# Patient Record
Sex: Male | Born: 1970 | Race: White | Hispanic: No | Marital: Married | Smoking: Never smoker
Health system: Southern US, Community
[De-identification: ages and names within clinical notes are randomized; demographics above are authoritative.]

## PROBLEM LIST (undated history)

## (undated) DIAGNOSIS — N200 Calculus of kidney: Secondary | ICD-10-CM

## (undated) DIAGNOSIS — I10 Essential (primary) hypertension: Secondary | ICD-10-CM

## (undated) HISTORY — DX: Calculus of kidney: N20.0

## (undated) HISTORY — DX: Essential (primary) hypertension: I10

---
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
## 2003-11-18 ENCOUNTER — Emergency Department (HOSPITAL_COMMUNITY): Admission: EM | Admit: 2003-11-18 | Discharge: 2003-11-18 | Payer: Self-pay | Admitting: Emergency Medicine

## 2003-12-03 ENCOUNTER — Ambulatory Visit (HOSPITAL_BASED_OUTPATIENT_CLINIC_OR_DEPARTMENT_OTHER): Admission: RE | Admit: 2003-12-03 | Discharge: 2003-12-03 | Payer: Self-pay | Admitting: Urology

## 2004-10-25 ENCOUNTER — Ambulatory Visit: Payer: Self-pay | Admitting: Family Medicine

## 2004-12-23 ENCOUNTER — Ambulatory Visit: Payer: Self-pay | Admitting: Internal Medicine

## 2004-12-27 ENCOUNTER — Ambulatory Visit: Payer: Self-pay | Admitting: Internal Medicine

## 2005-10-12 ENCOUNTER — Ambulatory Visit: Payer: Self-pay | Admitting: Family Medicine

## 2005-12-22 ENCOUNTER — Encounter: Admission: RE | Admit: 2005-12-22 | Discharge: 2005-12-22 | Payer: Self-pay | Admitting: Family Medicine

## 2005-12-22 ENCOUNTER — Ambulatory Visit: Payer: Self-pay | Admitting: Family Medicine

## 2005-12-26 ENCOUNTER — Encounter: Admission: RE | Admit: 2005-12-26 | Discharge: 2006-02-01 | Payer: Self-pay | Admitting: Family Medicine

## 2006-01-16 ENCOUNTER — Ambulatory Visit: Payer: Self-pay | Admitting: Family Medicine

## 2006-09-06 ENCOUNTER — Ambulatory Visit: Payer: Self-pay | Admitting: Internal Medicine

## 2006-09-06 LAB — CONVERTED CEMR LAB: Cyclic Citrullin Peptide Ab: 4 units (ref <20)

## 2006-10-12 ENCOUNTER — Ambulatory Visit: Payer: Self-pay | Admitting: Internal Medicine

## 2007-04-24 ENCOUNTER — Ambulatory Visit: Payer: Self-pay | Admitting: Family Medicine

## 2007-04-24 DIAGNOSIS — J069 Acute upper respiratory infection, unspecified: Secondary | ICD-10-CM | POA: Insufficient documentation

## 2007-06-14 ENCOUNTER — Ambulatory Visit: Payer: Self-pay | Admitting: Family Medicine

## 2007-06-14 DIAGNOSIS — J019 Acute sinusitis, unspecified: Secondary | ICD-10-CM

## 2007-12-10 ENCOUNTER — Emergency Department (HOSPITAL_COMMUNITY): Admission: EM | Admit: 2007-12-10 | Discharge: 2007-12-10 | Payer: Self-pay | Admitting: Emergency Medicine

## 2007-12-12 ENCOUNTER — Ambulatory Visit: Payer: Self-pay | Admitting: Family Medicine

## 2007-12-12 DIAGNOSIS — IMO0002 Reserved for concepts with insufficient information to code with codable children: Secondary | ICD-10-CM

## 2007-12-26 ENCOUNTER — Ambulatory Visit: Payer: Self-pay | Admitting: Family Medicine

## 2007-12-26 DIAGNOSIS — N200 Calculus of kidney: Secondary | ICD-10-CM | POA: Insufficient documentation

## 2007-12-26 HISTORY — DX: Calculus of kidney: N20.0

## 2007-12-30 LAB — CONVERTED CEMR LAB
BUN: 10 mg/dL (ref 6–23)
CO2: 31 meq/L (ref 19–32)
Chloride: 102 meq/L (ref 96–112)
Direct LDL: 127 mg/dL
Glucose, Bld: 90 mg/dL (ref 70–99)
Potassium: 4.2 meq/L (ref 3.5–5.1)
Sodium: 139 meq/L (ref 135–145)

## 2008-12-14 ENCOUNTER — Encounter (INDEPENDENT_AMBULATORY_CARE_PROVIDER_SITE_OTHER): Payer: Self-pay | Admitting: *Deleted

## 2008-12-14 ENCOUNTER — Ambulatory Visit: Payer: Self-pay | Admitting: Family Medicine

## 2008-12-14 DIAGNOSIS — M79609 Pain in unspecified limb: Secondary | ICD-10-CM

## 2009-06-25 ENCOUNTER — Ambulatory Visit: Payer: Self-pay | Admitting: Family Medicine

## 2009-12-09 ENCOUNTER — Ambulatory Visit: Payer: Self-pay | Admitting: Family Medicine

## 2010-07-05 NOTE — Assessment & Plan Note (Signed)
Summary: Eric Avila,HA,CONGESTION/CLE   Vital Signs:  Patient profile:   40 year old male Height:      70 inches Weight:      228.13 pounds BMI:     32.85 Temp:     98.4 degrees F oral Pulse rate:   92 / minute Pulse rhythm:   regular BP sitting:   122 / 84  (left arm) Cuff size:   large  Vitals Entered By: Delilah Shan CMA Duncan Dull) (June 25, 2009 11:54 AM) CC: Eric Avila, headache, congestion   History of Present Illness: 40 yo with frontal headache, sinus congestion and pressure x 3 weeks. Ears are popping, sometimes Eric Avila when he lies down. No n/v/d. No sycope or presyncope. denies symptoms of orthostatis. Has had fevers and chills (subjective). Mildly productive cough, no wheezing, no shortness of breath.  Current Medications (verified): 1)  Augmentin 875-125 Mg Tabs (Amoxicillin-Pot Clavulanate) .Marland Kitchen.. 1 Tab By Mouth Two Times A Day X 10 Days  Allergies (verified): No Known Drug Allergies  Review of Systems      See HPI General:  Complains of chills and fever; denies malaise. ENT:  Complains of nasal congestion, postnasal drainage, and sinus pressure; denies sore throat. CV:  Denies chest pain or discomfort, difficulty breathing at night, difficulty breathing while lying down, leg cramps with exertion, near fainting, palpitations, and shortness of breath with exertion. Resp:  Complains of cough and sputum productive; denies shortness of breath and wheezing.  Physical Exam  General:  alert, well-developed, well-nourished, and well-hydrated.   Ears:  mod clear fluid TMs bilaterally. Nose:  nasal dischargemucosal pallor.  L > R Frontal sinuses TTP Mouth:  good dentition, pharynx pink and moist, and no exudates.   Lungs:  normal respiratory effort, no intercostal retractions, no accessory muscle use, and normal breath sounds.   Heart:  normal rate, regular rhythm, and no murmur.   Psych:  normally interactive, not anxious appearing, and not depressed appearing.      Impression & Recommendations:  Problem # 1:  SINUSITIS- ACUTE-NOS (ICD-461.9) Assessment New Given duration of symptoms, will treat with abx. Continue supportive care.  See patient instructions for details. His updated medication list for this problem includes:    Augmentin 875-125 Mg Tabs (Amoxicillin-pot clavulanate) .Marland Kitchen... 1 tab by mouth two times a day x 10 days  Complete Medication List: 1)  Augmentin 875-125 Mg Tabs (Amoxicillin-pot clavulanate) .Marland Kitchen.. 1 tab by mouth two times a day x 10 days Prescriptions: AUGMENTIN 875-125 MG TABS (AMOXICILLIN-POT CLAVULANATE) 1 tab by mouth two times a day x 10 days  #20 x 0   Entered and Authorized by:   Ruthe Mannan MD   Signed by:   Ruthe Mannan MD on 06/25/2009   Method used:   Electronically to        [facility] [address] [phone] (retail)       [address]       [place], [place]  [address]       Ph: [phone] or [phone]       Fax: [phone]   RxID:   [id number]   Prior Medications (reviewed today): None Current Allergies (reviewed today): No known allergies

## 2010-07-05 NOTE — Letter (Signed)
Summary: Physical Exam Form/[employer]  Physical Exam Form/[employer]   Imported By: Lanelle Bal 12/14/2009 09:48:06  _____________________________________________________________________  External Attachment:    Type:   Image     Comment:   External Document

## 2010-07-05 NOTE — Assessment & Plan Note (Signed)
Summary: [employer]/CLE   Vital Signs:  Patient profile:   40 year old male Height:      70 inches Weight:      220.4 pounds BMI:     31.74 Temp:     98.7 degrees F oral Pulse rate:   92 / minute Pulse rhythm:   regular BP sitting:   122 / 90  (left arm) Cuff size:   large  Vitals Entered By: Benny Lennert CMA (AAMA) (December 09, 2009 8:22 AM)  History of Present Illness: Chief complaint [employer] physical    Preventive Screening-Counseling & Management  Alcohol-Tobacco     Alcohol drinks/day: <1     Alcohol Counseling: not indicated; use of alcohol is not excessive or problematic     Smoking Status: never     Passive Smoke Exposure: yes  Caffeine-Diet-Exercise     Caffeine use/day: 3     Does Patient Exercise: yes     Type of exercise: walks 1 to 1 and 1/2 mile qd     Times/week: 7  Hep-HIV-STD-Contraception     HIV Risk: no     STD Risk: no risk noted     Testicular SE Education/Counseling to perform regular STE     Sun Exposure-Excessive: occasionally  Safety-Violence-Falls     Seat Belt Use: 75      Sexual History:  currently monogamous.        Drug Use:  no.    Allergies (verified): No Known Drug Allergies  Past History:  Past medical, surgical, family and social histories (including risk factors) reviewed, and no changes noted (except as noted below).  Past Medical History: Reviewed history from 02/11/2007 and no changes required. Unremarkable  Past Surgical History: Reviewed history from 12/26/2007 and no changes required. Denies surgical history R knee open and orthroscopic--Dr Fannie Knee  Family History: Reviewed history from 12/26/2007 and no changes required. Father: --HBP Mother: DM, HBP, heart problems Siblings: 2 br--1--DM, HBP, obese                         1--L&W   DM-0 MI- P side.  MGF died of CHF, 2 P uncles have cerebral aneurisms CVA-  Prostate Cancer-0 Breast Cancer-0 Ovarian Cancer-0 Uterine Cancer-0 Colon  Cancer-0 Drug/ ETOH Abuse-0 Depression-   Social History: Reviewed history from 12/26/2007 and no changes required. Never Smoked Alcohol use-no Marital Status: Married Children: 1 Occupation: Curator STD Risk:  no risk noted Sexual History:  currently monogamous  Review of Systems  General: Denies fever, chills, sweats, and anorexia. Eyes: Denies blurring. ENT: Denies earache, ear discharge, decreased hearing, nasal congestion, and sore throat. CV: Denies chest pains, dyspnea on exertion, palpitations, and syncope. Resp: Denies cough, cough with exercise, dyspnea at rest, excessive sputum, nighttime cough or wheeze, and wheezing GI: Denies nausea, vomiting, diarrhea, constipation, change in bowel habits, abdominal pain, melena, BRBPR  GU: dysuria, discharge, frequency,genital sores, STD concern. MS: no back pain, joint pain, stiffness, and arthritis. Derm: No rash, itching, and dryness Neuro: No abnormal gait, frequent headaches, paresthesias, seizures, vertigo, and weakness Psych: No anxiety, behavioral problems, compulsive behavior, depression, hyperactivity, and inattentive. Endo: No polydipsia, polyphagia, polyuria, and unusual weight change Heme: No bruising or LAD Allergy: No urticaria or hayfever   Otherwise, the pertinent positives and negatives are listed above and in the HPI, otherwise a full review of systems has been reviewed and is negative unless noted positive.   Physical Exam  General:  Well-developed,well-nourished,in no acute distress; alert,appropriate and cooperative throughout examination Head:  normocephalic and atraumatic.   Eyes:  pupils equal, pupils round, pupils reactive to light, and pupils react to accomodation.   Ears:  External ear exam shows no significant lesions or deformities.  Otoscopic exam on L shows dull TM Nose:  External nasal examination shows no deformity or inflammation. Nasal mucosa are pink and moist without lesions or  exudates. Mouth:  Oral mucosa and oropharynx without lesions or exudates.  Teeth in good repair. Neck:  No deformities, masses, or tenderness noted. Chest Wall:  No deformities, masses, tenderness or gynecomastia noted. Lungs:  Normal respiratory effort, chest expands symmetrically. Lungs are clear to auscultation, no crackles or wheezes. Heart:  Normal rate and regular rhythm. S1 and S2 normal without gallop, murmur, click, rub or other extra sounds. Abdomen:  Bowel sounds positive,abdomen soft and non-tender without masses, organomegaly or hernias noted. Genitalia:  Testes bilaterally descended without nodularity, tenderness or masses. No scrotal masses or lesions. No penis lesions or urethral discharge. Msk:  normal ROM and no crepitation.   Extremities:  No clubbing, cyanosis, edema, or deformity noted with normal full range of motion of all joints.   Neurologic:  alert & oriented X3 and gait normal.   Cervical Nodes:  No lymphadenopathy noted Psych:  Cognition and judgment appear intact. Alert and cooperative with normal attention span and concentration. No apparent delusions, illusions, hallucinations   Impression & Recommendations:  Problem # 1:  HEALTH MAINTENANCE EXAM (ICD-V70.0) The patient's preventative maintenance and recommended screening tests for an annual wellness exam were reviewed in full today. Brought up to date unless services declined.  Counselled on the importance of diet, exercise, and its role in overall health and mortality. The patient's FH and SH was reviewed, including their home life, tobacco status, and drug and alcohol status.   scout forms completed  Current Allergies (reviewed today): No known allergies

## 2010-10-21 NOTE — Op Note (Signed)
NAME:  Eric Avila, Eric Avila                         ACCOUNT NO.:  [id number]   MEDICAL RECORD NO.:  [id number]                   PATIENT TYPE:  AMB   LOCATION:  [facility]                                 FACILITY:  [facility]   PHYSICIAN:  Sigmund I. Patsi Sears, M.D.         DATE OF BIRTH:  07-04-1970   DATE OF PROCEDURE:  12/03/2003  DATE OF DISCHARGE:                                 OPERATIVE REPORT   PREOPERATIVE DIAGNOSIS:  Distal left ureterovesical junction stone.   POSTOPERATIVE DIAGNOSIS:  Distal left ureterovesical junction stone.   OPERATION:  1. Cystourethroscopy.  2. Left retrograde pyelogram.  3. Basket extraction of left lower ureteral stone.   SURGEON:  Sigmund I. Patsi Sears, M.D.   ANESTHESIA:  General LMA.   PREPARATION:  __________.   The patient was brought to the operating room, placed on the operating table  in the dorsal supine position where a general LMA anesthesia was introduced.  He was then replaced in the dorsal lithotomy position where the pubis was  prepped with Betadine solution and draped in the usual fashion.   PROCEDURE:  Cystourethroscopy was accomplished, and shows the stone in the  left intramural portion of the ureter.  A basket was passed around the  stone, and the stone extracted.  The stone was multifaceted, was photo  documented.  A left retrograde pyelogram was then performed again, showing  no evidence of stone in the ureter, and under fluoroscopic control, contrast  was seen to easily flow out the ureteral orifice.  Therefore, no double-J  catheter was passed.  The patient was given IV Toradol, awakened and taken  to the recovery room in good condition.                                               Sigmund I. Patsi Sears, M.D.    SIT/MEDQ  D:  12/03/2003  T:  12/03/2003  Job:  [id number]

## 2014-01-16 ENCOUNTER — Encounter: Payer: Self-pay | Admitting: Internal Medicine

## 2014-01-16 ENCOUNTER — Ambulatory Visit (INDEPENDENT_AMBULATORY_CARE_PROVIDER_SITE_OTHER): Payer: 59 | Admitting: Internal Medicine

## 2014-01-16 VITALS — BP 130/84 | HR 77 | Temp 98.1°F | Wt 238.5 lb

## 2014-01-16 DIAGNOSIS — R5383 Other fatigue: Principal | ICD-10-CM

## 2014-01-16 DIAGNOSIS — R079 Chest pain, unspecified: Secondary | ICD-10-CM | POA: Diagnosis not present

## 2014-01-16 DIAGNOSIS — R5381 Other malaise: Secondary | ICD-10-CM

## 2014-01-16 DIAGNOSIS — IMO0001 Reserved for inherently not codable concepts without codable children: Secondary | ICD-10-CM

## 2014-01-16 DIAGNOSIS — R030 Elevated blood-pressure reading, without diagnosis of hypertension: Secondary | ICD-10-CM

## 2014-01-16 LAB — COMPREHENSIVE METABOLIC PANEL
ALBUMIN: 4.3 g/dL (ref 3.5–5.2)
ALK PHOS: 68 U/L (ref 39–117)
ALT: 23 U/L (ref 0–53)
AST: 18 U/L (ref 0–37)
BUN: 11 mg/dL (ref 6–23)
CHLORIDE: 103 meq/L (ref 96–112)
CO2: 28 meq/L (ref 19–32)
Calcium: 9.1 mg/dL (ref 8.4–10.5)
Creat: 0.98 mg/dL (ref 0.50–1.35)
GLUCOSE: 88 mg/dL (ref 70–99)
Potassium: 3.9 mEq/L (ref 3.5–5.3)
SODIUM: 140 meq/L (ref 135–145)
TOTAL PROTEIN: 7 g/dL (ref 6.0–8.3)
Total Bilirubin: 0.4 mg/dL (ref 0.2–1.2)

## 2014-01-16 LAB — CBC
HCT: 39.6 % (ref 39.0–52.0)
Hemoglobin: 14 g/dL (ref 13.0–17.0)
MCH: 30.7 pg (ref 26.0–34.0)
MCHC: 35.4 g/dL (ref 30.0–36.0)
MCV: 86.8 fL (ref 78.0–100.0)
PLATELETS: 247 10*3/uL (ref 150–400)
RBC: 4.56 MIL/uL (ref 4.22–5.81)
RDW: 14.1 % (ref 11.5–15.5)
WBC: 8 10*3/uL (ref 4.0–10.5)

## 2014-01-16 LAB — LIPID PANEL
CHOL/HDL RATIO: 5.6 ratio
CHOLESTEROL: 197 mg/dL (ref 0–200)
HDL: 35 mg/dL — ABNORMAL LOW (ref >39)
LDL Cholesterol: 110 mg/dL — ABNORMAL HIGH (ref 0–99)
Triglycerides: 262 mg/dL — ABNORMAL HIGH (ref <150)
VLDL: 52 mg/dL — AB (ref 0–40)

## 2014-01-16 NOTE — Progress Notes (Signed)
Pre visit review using our clinic review tool, if applicable. No additional management support is needed unless otherwise documented below in the visit note. 

## 2014-01-16 NOTE — Patient Instructions (Addendum)

## 2014-01-16 NOTE — Progress Notes (Signed)
Subjective:    Patient ID: Eric Avila, male    DOB: 1971/05/26, 43 y.o.   MRN: [id number]  HPI  Pt presents to the clinic today with c/o elevated blood pressure. He noticed this 2 days ago. He reports his blood pressure was 157/90. He has had some headache and fatigue but denies chest tightness or shortness of breath.He does report chest pain on his left side and in the left side of his back. The pain does not radiate. He is a smoker. He has no previous heart history that he is aware of.  Of note, he is a new patient who will be establishing care with Dr. Patsy Lageropland next week.  Review of Systems      No past medical history on file.  No current outpatient prescriptions on file.   No current facility-administered medications for this visit.    Not on File  No family history on file.  History   Social History  . Marital Status: Married    Spouse Name: N/A    Number of Children: N/A  . Years of Education: N/A   Occupational History  . Not on file.   Social History Main Topics  . Smoking status: Not on file  . Smokeless tobacco: Not on file  . Alcohol Use: Not on file  . Drug Use: Not on file  . Sexual Activity: Not on file   Other Topics Concern  . Not on file   Social History Narrative  . No narrative on file     Constitutional: Pt reports fatigue. Denies fever, malaise, headache or abrupt weight changes.  Respiratory: Denies difficulty breathing, shortness of breath, cough or sputum production.   Cardiovascular: Denies chest pain, chest tightness, palpitations or swelling in the hands or feet.  Neurological: Denies dizziness, difficulty with memory, difficulty with speech or problems with balance and coordination.   No other specific complaints in a complete review of systems (except as listed in HPI above).  Objective:   Physical Exam   BP 130/84  Pulse 77  Temp(Src) 98.1 F (36.7 C) (Oral)  Wt 238 lb 8 oz (108.183 kg)  SpO2 97% Wt Readings from  Last 3 Encounters:  01/16/14 238 lb 8 oz (108.183 kg)  12/09/09 220 lb 6.4 oz (99.973 kg)  06/25/09 228 lb 2.1 oz (103.48 kg)    General: Appears his stated age,obese but  well developed, well nourished in NAD. Cardiovascular: Normal rate and rhythm. S1,S2 noted.  No murmur, rubs or gallops noted. No JVD or BLE edema. No carotid bruits noted. Pulmonary/Chest: Normal effort and positive vesicular breath sounds. No respiratory distress. No wheezes, rales or ronchi noted.    BMET    Component Value Date/Time   NA 139 12/26/2007 1519   K 4.2 12/26/2007 1519   CL 102 12/26/2007 1519   CO2 31 12/26/2007 1519   GLUCOSE 90 12/26/2007 1519   BUN 10 12/26/2007 1519   CREATININE 1.0 12/26/2007 1519   CALCIUM 9.4 12/26/2007 1519   GFRNONAA 89 12/26/2007 1519   GFRAA 108 12/26/2007 1519    Lipid Panel     Component Value Date/Time   CHOL 208* 12/26/2007 1519         Assessment & Plan:   Elevated blood pressure, fatigue, chest pain:  Will obtain ECG today Will obtain CBC, CMET and lipid as well BP not elevated today- will hold off on medication and have you address this with Dr. Patsy Lageropland next week Pt does not  appear in distress, I think it is okay to continue outpatient followup May eventually need referral to cardiology for stress test but will wait until labs are back  F/u in 1 week with Dr. Patsy Lager

## 2014-01-21 ENCOUNTER — Encounter: Payer: Self-pay | Admitting: Family Medicine

## 2014-01-21 ENCOUNTER — Ambulatory Visit (INDEPENDENT_AMBULATORY_CARE_PROVIDER_SITE_OTHER): Payer: PRIVATE HEALTH INSURANCE | Admitting: Family Medicine

## 2014-01-21 VITALS — BP 126/82 | HR 70 | Temp 98.2°F | Ht 70.5 in | Wt 239.0 lb

## 2014-01-21 DIAGNOSIS — R5381 Other malaise: Secondary | ICD-10-CM

## 2014-01-21 DIAGNOSIS — R5383 Other fatigue: Principal | ICD-10-CM

## 2014-01-21 NOTE — Progress Notes (Signed)
[facility] [address] [place], [address] Phone: [phone] Fax: [phone] 01/21/2014  MRN: [id number] DOB: 1971-04-17 Primary Physician:  Hannah Beat, MD   Subjective:   History of Present Illness:  Eric Avila is a 43 y.o. very pleasant male patient who presents with the following:  The patient presents for followup lab work, and he has been having some significant fatigue off and on for months. He is working approximately 75-80 hours a week. He is tired often falls asleep easily when he is not at work. He works at a [employer] and [employer].   Everything is going along well at home, and he does snore, but only minimally. Body mass index is 33.8 kg/(m^2).   Past Medical History, Surgical History, Social History, Family History, Problem List, Medications, and Allergies have been reviewed and updated if relevant.  Review of Systems:  GEN: No acute illnesses, no fevers, chills. GI: No n/v/d, eating normally Pulm: No SOB Interactive and getting along well at home.  Otherwise, ROS is as per the HPI.  Objective:   Physical Examination: BP 126/82  Pulse 70  Temp(Src) 98.2 F (36.8 C) (Oral)  Ht 5' 10.5" (1.791 m)  Wt 239 lb (108.41 kg)  BMI 33.80 kg/m2   GEN: WDWN, NAD, Non-toxic, Alert & Oriented x 3 HEENT: Atraumatic, Normocephalic.  Ears and Nose: No external deformity. EXTR: No clubbing/cyanosis/edema NEURO: Normal gait.  PSYCH: Normally interactive. Conversant. Not depressed or anxious appearing.  Calm demeanor.   Laboratory and Imaging Data: Results for orders placed in visit on 01/16/14  CBC      Result Value Ref Range   WBC 8.0  4.0 - 10.5 K/uL   RBC 4.56  4.22 - 5.81 MIL/uL   Hemoglobin 14.0  13.0 - 17.0 g/dL   HCT 86.5  78.4 - 69.6 %   MCV 86.8  78.0 - 100.0 fL   MCH 30.7  26.0 - 34.0 pg   MCHC 35.4  30.0 - 36.0 g/dL   RDW 29.5  28.4 - 13.2 %   Platelets 247  150 - 400 K/uL    COMPREHENSIVE METABOLIC PANEL      Result Value Ref Range   Sodium 140  135 - 145 mEq/L   Potassium 3.9  3.5 - 5.3 mEq/L   Chloride 103  96 - 112 mEq/L   CO2 28  19 - 32 mEq/L   Glucose, Bld 88  70 - 99 mg/dL   BUN 11  6 - 23 mg/dL   Creat 4.40  1.02 - 7.25 mg/dL   Total Bilirubin 0.4  0.2 - 1.2 mg/dL   Alkaline Phosphatase 68  39 - 117 U/L   AST 18  0 - 37 U/L   ALT 23  0 - 53 U/L   Total Protein 7.0  6.0 - 8.3 g/dL   Albumin 4.3  3.5 - 5.2 g/dL   Calcium 9.1  8.4 - 36.6 mg/dL  LIPID PANEL      Result Value Ref Range   Cholesterol 197  0 - 200 mg/dL   Triglycerides 440 (*) <150 mg/dL   HDL 35 (*) >34 mg/dL   Total CHOL/HDL Ratio 5.6     VLDL 52 (*) 0 - 40 mg/dL   LDL Cholesterol 742 (*) 0 - 99 mg/dL     Assessment & Plan:   Other malaise and fatigue   We reviewed all of his recent lab work. He  has a mildly elevated cholesterol level. Recommended that he lose at least 20 pounds, and also think most of this fatigue is coming from working so much. Recommended that he discontinue if he is able, get better exercise and sleep patterns.  If his symptoms persist despite this, we discussed potentially checking hormone levels, but at this point he is not ready to go on any kind of medication anyway.  Signed,  Elpidio GaleaSpencer T. Camie Hauss, MD, CAQ Sports Medicine   Patient's Medications  New Prescriptions   No medications on file  Previous Medications   IBUPROFEN (ADVIL,MOTRIN) 200 MG TABLET    Take 200 mg by mouth every 6 (six) hours as needed.  Modified Medications   No medications on file  Discontinued Medications   No medications on file

## 2014-01-21 NOTE — Progress Notes (Signed)
Pre visit review using our clinic review tool, if applicable. No additional management support is needed unless otherwise documented below in the visit note. 

## 2014-12-14 ENCOUNTER — Ambulatory Visit (INDEPENDENT_AMBULATORY_CARE_PROVIDER_SITE_OTHER): Payer: 59 | Admitting: Family Medicine

## 2014-12-14 ENCOUNTER — Encounter: Payer: Self-pay | Admitting: Family Medicine

## 2014-12-14 VITALS — BP 140/90 | HR 78 | Temp 98.6°F | Ht 70.5 in | Wt 245.2 lb

## 2014-12-14 DIAGNOSIS — S96812A Strain of other specified muscles and tendons at ankle and foot level, left foot, initial encounter: Secondary | ICD-10-CM | POA: Diagnosis not present

## 2014-12-14 DIAGNOSIS — S93692A Other sprain of left foot, initial encounter: Secondary | ICD-10-CM

## 2014-12-14 NOTE — Progress Notes (Signed)
Pre visit review using our clinic review tool, if applicable. No additional management support is needed unless otherwise documented below in the visit note. 
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
## 2016-08-29 ENCOUNTER — Ambulatory Visit (INDEPENDENT_AMBULATORY_CARE_PROVIDER_SITE_OTHER): Payer: 59 | Admitting: Family Medicine

## 2016-08-29 ENCOUNTER — Encounter: Payer: Self-pay | Admitting: Family Medicine

## 2016-08-29 VITALS — BP 160/78 | HR 83 | Temp 98.3°F | Ht 70.5 in | Wt 261.8 lb

## 2016-08-29 DIAGNOSIS — R220 Localized swelling, mass and lump, head: Secondary | ICD-10-CM

## 2016-08-29 DIAGNOSIS — R030 Elevated blood-pressure reading, without diagnosis of hypertension: Secondary | ICD-10-CM

## 2016-08-29 DIAGNOSIS — J0100 Acute maxillary sinusitis, unspecified: Secondary | ICD-10-CM | POA: Diagnosis not present

## 2016-08-29 DIAGNOSIS — I10 Essential (primary) hypertension: Secondary | ICD-10-CM

## 2016-08-29 DIAGNOSIS — J019 Acute sinusitis, unspecified: Secondary | ICD-10-CM | POA: Insufficient documentation

## 2016-08-29 HISTORY — DX: Essential (primary) hypertension: I10

## 2016-08-29 MED ORDER — DOXYCYCLINE HYCLATE 100 MG PO TABS: 100.0000 mg | ORAL_TABLET | Freq: Two times a day (BID) | ORAL | 0 refills | Status: DC

## 2016-08-29 MED ORDER — AMOXICILLIN-POT CLAVULANATE 875-125 MG PO TABS: 1.0000 | ORAL_TABLET | Freq: Two times a day (BID) | ORAL | 0 refills | Status: DC

## 2016-08-29 NOTE — Progress Notes (Signed)
Pre visit review using our clinic review tool, if applicable. No additional management support is needed unless otherwise documented below in the visit note. 

## 2016-08-29 NOTE — Patient Instructions (Signed)
I suspect you have a sinus infection / cannot rule out a skin infection  Take both the augmentin and doxycycline as directed with non dairy food  Drink fluids  Get rest  advil as needed also with food up to every 6 hours  Warm compresses Keep face clean with soap and water  Breathe steam Watch for fever and let us know  Update if not starting to improve in a week or if worsening   Follow up with Dr Patsy Lageropland for r e check and also for blood pressure

## 2016-08-29 NOTE — Assessment & Plan Note (Signed)
With sinus pain and congestion  Some L facial swelling and discomfort Cover with augmentin  Disc use of warm comp/ nasal saline and steam for congestion  Close f/u in light of swelling  Ibuprofen prn

## 2016-08-29 NOTE — Assessment & Plan Note (Signed)
This may be from discomfort today  Close f/u- appt with pcp on 3/29 for re check
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
## 2016-08-29 NOTE — Progress Notes (Signed)
Subjective:    Patient ID: Eric Avila, male    DOB: April 28, 1971, 46 y.o.   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 Facial swelling 08/29/2016  . Acute sinusitis 08/29/2016  . Elevated BP without diagnosis of hypertension 08/29/2016  . RENAL CALCULUS 12/26/2007   No past medical history on file. No past surgical history on file. Social History  Substance Use Topics  . Smoking status: Former Smoker    Packs/day: 0.25    Types: Cigarettes  . Smokeless tobacco: Never Used  . Alcohol use No   No family history on file. No Known Allergies Current Outpatient Prescriptions on File Prior to Visit  Medication Sig Dispense Refill  . ibuprofen (ADVIL,MOTRIN) 200 MG tablet Take 200 mg by mouth every 6 (six) hours as needed.     No current facility-administered medications on file prior to visit.     Review of Systems  Constitutional: Positive for appetite change. Negative for fatigue and fever.  HENT: Positive for congestion, ear pain, facial swelling, postnasal drip, rhinorrhea, sinus pain, sinus pressure and sore throat. Negative for nosebleeds.   Eyes: Negative for pain, redness  and itching.  Respiratory: Positive for cough. Negative for shortness of breath and wheezing.   Cardiovascular: Negative for chest pain.  Gastrointestinal: Negative for abdominal pain, diarrhea, nausea and vomiting.  Endocrine: Negative for polyuria.  Genitourinary: Negative for dysuria, frequency and urgency.  Musculoskeletal: Negative for arthralgias and myalgias.  Allergic/Immunologic: Negative for immunocompromised state.  Neurological: Positive for headaches. Negative for dizziness, tremors, syncope, weakness and numbness.  Hematological: Negative for adenopathy. Does not bruise/bleed easily.  Psychiatric/Behavioral: Negative for dysphoric mood. The patient is not nervous/anxious.        Objective:   Physical Exam  Constitutional: He appears well-developed and well-nourished. No distress.  obese and well appearing   HENT:  Head: Normocephalic and atraumatic.  Right Ear: External ear normal.  Left Ear: External ear normal.  Mouth/Throat: Oropharynx is clear and moist.  Nares are injected and congested  Scant rhinorrhea Swelling and erythema of face in  L (maxillary area) w/o skin interruption or scab/wound  Maxillary tenderness on L     Eyes: Conjunctivae and EOM are normal. Pupils are equal, round, and reactive to light. Right eye exhibits no discharge. No scleral icterus.  Swelling of face under eye -not inv lid  No conj changes   Neck: Normal range  of motion. No thyromegaly present.  Cardiovascular: Normal rate, regular rhythm and normal heart sounds.   Pulmonary/Chest: Effort normal and breath sounds normal. No respiratory distress. He has no wheezes. He has no rales.  Lymphadenopathy:    He has no cervical adenopathy.  Neurological: He is alert. He has normal reflexes. No cranial nerve deficit. He exhibits normal muscle tone. Coordination normal.  Skin: Skin is warm and dry. There is erythema.  Erythema and swelling of L maxillary sinus area   Psychiatric: He has a  normal mood and affect.          Assessment & Plan:   Problem List Items Addressed This Visit      Respiratory   Acute sinusitis    With sinus pain and congestion  Some L facial swelling and discomfort Cover with augmentin  Disc use of warm comp/ nasal saline and steam for congestion  Close f/u in light of swelling  Ibuprofen prn      Relevant Medications   amoxicillin-clavulanate (AUGMENTIN) 875-125 MG tablet   doxycycline (VIBRA-TABS) 100 MG tablet     Other   Elevated BP without diagnosis of hypertension    This may be from discomfort today  Close f/u- appt with pcp on 3/29 for re check      Facial swelling    While I suspect this is from sinusitis-cannot totally r/o cellulitis  Will watch closely-f/u planned 2 d Double cover with augmentin and doxy (in case of mrsa)  No wound- pt pt recounts being hit by a pc of rubber in face last week Strict inst to call and go to ED if pain/redness or swelling increase at all  Disc symptomatic care  f/u 3/29 with pcp

## 2016-08-31 ENCOUNTER — Ambulatory Visit (INDEPENDENT_AMBULATORY_CARE_PROVIDER_SITE_OTHER): Payer: 59 | Admitting: Family Medicine

## 2016-08-31 ENCOUNTER — Encounter: Payer: Self-pay | Admitting: Family Medicine

## 2016-08-31 VITALS — BP 152/100 | HR 94 | Temp 98.1°F | Ht 70.5 in | Wt 258.0 lb

## 2016-08-31 DIAGNOSIS — I10 Essential (primary) hypertension: Secondary | ICD-10-CM

## 2016-08-31 DIAGNOSIS — E669 Obesity, unspecified: Secondary | ICD-10-CM | POA: Insufficient documentation

## 2016-08-31 MED ORDER — LISINOPRIL 20 MG PO TABS: 20.0000 mg | ORAL_TABLET | Freq: Every day | ORAL | 3 refills | Status: DC

## 2016-08-31 NOTE — Progress Notes (Signed)
Pre visit review using our clinic review tool, if applicable. No additional management support is needed unless otherwise documented below in the visit note. 

## 2016-08-31 NOTE — Progress Notes (Signed)
Dr. Karleen Hampshire T. Toneisha Savary, MD, CAQ [facility] [address] [place], [address] Phone: [phone] Fax: [phone]  08/31/2016  Patient: Eric Avila, MRN: [id number], DOB: 1970-12-24, 46 y.o.  Primary Physician:  Hannah Beat, MD   Chief Complaint  Patient presents with  . Follow up Blood Pressure    Recent OV for facial swelling and hypertensive event.   Subjective:   Eric Avila is a 46 y.o. very pleasant male patient who presents with the following:  Wt Readings from Last 3 Encounters:  08/31/16 258 lb (117 kg)  08/29/16 261 lb 12 oz (118.7 kg)  12/14/14 245 lb 4 oz (111.2 kg)    BP Readings from Last 3 Encounters:  08/31/16 (!) 152/100  08/29/16 (!) 160/78  12/14/14 140/90    Weight in (lb) to have BMI = 25: 176.4   Body mass index is 36.5 kg/m.   h/o kidney stones.   Patient has never had a known history of hypertension previously, but he has gained weight steadily over the years, and age 66 now, he has had some elevation of his blood pressure including notably on his last office visit. He is less physically active now compared he was years ago.  Past Medical History, Surgical History, Social History, Family History, Problem List, Medications, and Allergies have been reviewed and updated if relevant.  Patient Active Problem List   Diagnosis Date Noted  . Elevated BP without diagnosis of hypertension 08/29/2016  . 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  No past surgical history on file.  Social History   Social History  . Marital status: Married    Spouse name: N/A  . Number of children: N/A  . Years of education: N/A   Occupational History  . Not on file.   Social History Main Topics  . Smoking status: Former Smoker    Packs/day: 0.25    Types: Cigarettes  . Smokeless tobacco: Never Used  . Alcohol use No  . Drug use: No  . Sexual activity: Not on file   Other  Topics Concern  . Not on file   Social History Narrative  . No narrative on file    No family history on file.  No Known Allergies  Medication list reviewed and updated in full in [facility].   GEN: No acute illnesses, no fevers, chills. GI: No n/v/d, eating normally Pulm: No SOB Interactive and getting along well at home.  Otherwise, ROS is as per the HPI.  Objective:   BP (!) 152/100 (BP Location: Left Arm, Patient Position: Sitting)   Pulse 94   Temp 98.1 F (36.7 C) (Oral)   Ht 5' 10.5" (1.791 m)   Wt 258 lb (117 kg)   SpO2 95%   BMI 36.50 kg/m   GEN: WDWN, NAD, Non-toxic, A & O x 3 HEENT: Atraumatic, Normocephalic. Neck supple. No masses, No LAD. Ears and Nose: No external deformity. CV: RRR, No M/G/R. No JVD. No thrill. No extra heart sounds. PULM: CTA B, no wheezes, crackles, rhonchi. No retractions. No resp. distress. No accessory muscle use. EXTR: No c/c/e NEURO Normal gait.  PSYCH: Normally interactive. Conversant. Not depressed or anxious appearing.  Calm demeanor.   Laboratory and Imaging Data:  Assessment and Plan:   Essential hypertension  Obesity (BMI 30-39.9)  Work on weight loss, increase exercise, and start blood pressure medication. Known history of nephrolithiasis.  Follow-up:  Return for 3-4 months for CPX.  Meds ordered this encounter  Medications  . lisinopril (PRINIVIL,ZESTRIL) 20 MG tablet    Sig: Take 1 tablet (20 mg total) by mouth daily.    Dispense:  30 tablet    Refill:  3   Signed,  Daevion Navarette T. Shaliyah Taite, MD   Allergies as of 08/31/2016   No Known Allergies     Medication List       Accurate as of 08/31/16  1:51 PM. Always use your most recent med list.          amoxicillin-clavulanate 875-125 MG tablet Commonly known as:  AUGMENTIN Take 1 tablet by mouth 2 (two) times daily.   doxycycline 100 MG tablet Commonly known as:  VIBRA-TABS Take 1 tablet (100 mg total) by mouth 2 (two) times daily. Take with  non dairy food   ibuprofen 200 MG tablet Commonly known as:  ADVIL,MOTRIN Take 200 mg by mouth every 6 (six) hours as needed.   lisinopril 20 MG tablet Commonly known as:  PRINIVIL,ZESTRIL Take 1 tablet (20 mg total) by mouth daily.

## 2016-09-21 ENCOUNTER — Encounter: Payer: Self-pay | Admitting: Family Medicine

## 2016-09-21 ENCOUNTER — Ambulatory Visit (INDEPENDENT_AMBULATORY_CARE_PROVIDER_SITE_OTHER)
Admission: RE | Admit: 2016-09-21 | Discharge: 2016-09-21 | Disposition: A | Discharge status: Home / Self Care | Payer: 59 | Source: Ambulatory Visit | Attending: Family Medicine | Admitting: Family Medicine

## 2016-09-21 ENCOUNTER — Ambulatory Visit (INDEPENDENT_AMBULATORY_CARE_PROVIDER_SITE_OTHER): Payer: 59 | Admitting: Family Medicine

## 2016-09-21 ENCOUNTER — Encounter (HOSPITAL_COMMUNITY): Payer: Self-pay | Admitting: Emergency Medicine

## 2016-09-21 ENCOUNTER — Telehealth: Payer: Self-pay

## 2016-09-21 ENCOUNTER — Emergency Department (HOSPITAL_COMMUNITY)
Admission: EM | Admit: 2016-09-21 | Discharge: 2016-09-21 | Disposition: A | Discharge status: Home / Self Care | Payer: 59 | Attending: Emergency Medicine | Admitting: Emergency Medicine

## 2016-09-21 VITALS — BP 146/106 | HR 81 | Temp 98.6°F | Wt 254.0 lb

## 2016-09-21 DIAGNOSIS — L03211 Cellulitis of face: Secondary | ICD-10-CM | POA: Insufficient documentation

## 2016-09-21 DIAGNOSIS — Z87891 Personal history of nicotine dependence: Secondary | ICD-10-CM | POA: Diagnosis not present

## 2016-09-21 DIAGNOSIS — R220 Localized swelling, mass and lump, head: Secondary | ICD-10-CM

## 2016-09-21 DIAGNOSIS — I10 Essential (primary) hypertension: Secondary | ICD-10-CM | POA: Diagnosis not present

## 2016-09-21 DIAGNOSIS — Z79899 Other long term (current) drug therapy: Secondary | ICD-10-CM | POA: Diagnosis not present

## 2016-09-21 DIAGNOSIS — J0100 Acute maxillary sinusitis, unspecified: Secondary | ICD-10-CM

## 2016-09-21 DIAGNOSIS — K047 Periapical abscess without sinus: Secondary | ICD-10-CM | POA: Diagnosis not present

## 2016-09-21 LAB — CBC WITH DIFFERENTIAL/PLATELET
BASOS PCT: 0.4 % (ref 0.0–3.0)
Basophils Absolute: 0 10*3/uL (ref 0.0–0.1)
EOS PCT: 1.2 % (ref 0.0–5.0)
Eosinophils Absolute: 0.1 10*3/uL (ref 0.0–0.7)
HEMATOCRIT: 43.4 % (ref 39.0–52.0)
Hemoglobin: 14.3 g/dL (ref 13.0–17.0)
LYMPHS PCT: 23.1 % (ref 12.0–46.0)
Lymphs Abs: 1.4 10*3/uL (ref 0.7–4.0)
MCHC: 32.9 g/dL (ref 30.0–36.0)
MCV: 91.7 fl (ref 78.0–100.0)
MONOS PCT: 11 % (ref 3.0–12.0)
Monocytes Absolute: 0.7 10*3/uL (ref 0.1–1.0)
NEUTROS ABS: 4 10*3/uL (ref 1.4–7.7)
Neutrophils Relative %: 64.3 % (ref 43.0–77.0)
PLATELETS: 200 10*3/uL (ref 150.0–400.0)
RBC: 4.73 Mil/uL (ref 4.22–5.81)
RDW: 13.8 % (ref 11.5–15.5)
WBC: 6.2 10*3/uL (ref 4.0–10.5)

## 2016-09-21 LAB — COMPREHENSIVE METABOLIC PANEL
ALT: 25 U/L (ref 0–53)
AST: 22 U/L (ref 0–37)
Albumin: 4.2 g/dL (ref 3.5–5.2)
Alkaline Phosphatase: 64 U/L (ref 39–117)
BUN: 10 mg/dL (ref 6–23)
CALCIUM: 9 mg/dL (ref 8.4–10.5)
CHLORIDE: 104 meq/L (ref 96–112)
CO2: 29 meq/L (ref 19–32)
Creatinine, Ser: 0.96 mg/dL (ref 0.40–1.50)
GFR: 89.59 mL/min (ref >60.00)
Glucose, Bld: 101 mg/dL — ABNORMAL HIGH (ref 70–99)
Potassium: 4.1 mEq/L (ref 3.5–5.1)
Sodium: 139 mEq/L (ref 135–145)
Total Bilirubin: 0.3 mg/dL (ref 0.2–1.2)
Total Protein: 7.4 g/dL (ref 6.0–8.3)

## 2016-09-21 MED ORDER — DOXYCYCLINE HYCLATE 100 MG PO CAPS: 100.0000 mg | ORAL_CAPSULE | Freq: Two times a day (BID) | ORAL | 0 refills | Status: DC

## 2016-09-21 MED ORDER — KETOROLAC TROMETHAMINE 60 MG/2ML IM SOLN
60.0000 mg | Freq: Once | INTRAMUSCULAR | Status: AC
  Administered 2016-09-21: 60 mg via INTRAMUSCULAR

## 2016-09-21 MED ORDER — IOPAMIDOL (ISOVUE-300) INJECTION 61%
80.0000 mL | Freq: Once | INTRAVENOUS | Status: AC | PRN
  Administered 2016-09-21: 80 mL via INTRAVENOUS

## 2016-09-21 MED ORDER — CEFTRIAXONE SODIUM 1 G IJ SOLR
1.0000 g | Freq: Once | INTRAMUSCULAR | Status: AC
  Administered 2016-09-21: 1 g via INTRAMUSCULAR

## 2016-09-21 MED ORDER — SULFAMETHOXAZOLE-TRIMETHOPRIM 800-160 MG PO TABS: 1.0000 | ORAL_TABLET | Freq: Two times a day (BID) | ORAL | 0 refills | Status: DC

## 2016-09-21 MED ORDER — TRAMADOL HCL 50 MG PO TABS: 50.0000 mg | ORAL_TABLET | Freq: Three times a day (TID) | ORAL | 0 refills | Status: DC | PRN

## 2016-09-21 NOTE — Telephone Encounter (Signed)
Stacy from [facility] called with a call report:  Left facial cellulitis with adjacent abscess. Report should be in Epic.

## 2016-09-21 NOTE — ED Provider Notes (Signed)
[facility]-EMERGENCY DEPT Provider Note   CSN: [id number] Arrival date & time: 09/21/16  1504  By signing my name below, I, Eric Avila, attest that this documentation has been prepared under the direction and in the presence of [address] [employer]. Electronically Signed: Teofilo Avila, ED Scribe. 09/21/2016. 3:56 PM.    History   Chief Complaint Chief Complaint  Patient presents with  . Facial Swelling    The history is provided by the patient and medical records. No language interpreter was used.   HPI Comments:  Eric Avila is a 46 y.o. male who presents to the Emergency Department complaining of constant left facial swelling that began suddenly this AM. He describes his current pain as 5/10, dull, constant nonradiating L cheek pain, worse with direct pressure, and has tried mucinex with mild temporary relief. He was seen 4 weeks ago by his PCP at [facility] for more severe facial swelling and was diagnosed with sinusitis, and was treated with augmentin and doxycycline which he finished last week. Symptoms had resolved until this morning. He was seen by his PCP today, and was given bactrim and he had a CT of his face that showed left facial cellulitis with a 1.2x1.2x0.6cm abscess adjacent to periapical lucency of left upper cuspid, and mild chronic left maxillary sinusitis. He was referred to the ED for ENT consultation based on the CT results. However, he has an appointment with ENT tomorrow. Denies dental pain, gum swelling/drainage, sore throat, ear pain/drainage, drooling, trismus, rhinorrhea, fevers, chills, CP, SOB, abd pain, N/V/D/C, hematuria, dysuria, myalgias, arthralgias, numbness, tingling, focal weakness, or any other complaints at this time.    Past Medical History:  Diagnosis Date  . Hypertension 08/29/2016  . Nephrolithiasis 12/26/2007   Qualifier: Diagnosis of  By: Jillyn Hidden FNP, Mcarthur Rossetti     Patient Active Problem List   Diagnosis Date Noted  .  Obesity (BMI 30-39.9) 08/31/2016  . Facial swelling 08/29/2016  . Acute sinus infection 08/29/2016  . Hypertension 08/29/2016  . Nephrolithiasis 12/26/2007    History reviewed. No pertinent surgical history.     Home Medications    Prior to Admission medications   Medication Sig Start Date End Date Taking? Authorizing Provider  ibuprofen (ADVIL,MOTRIN) 200 MG tablet Take 200 mg by mouth every 6 (six) hours as needed.    Historical Provider, MD  lisinopril (PRINIVIL,ZESTRIL) 20 MG tablet Take 1 tablet (20 mg total) by mouth daily. 08/31/16   Hannah Beat, MD  sulfamethoxazole-trimethoprim (BACTRIM DS,SEPTRA DS) 800-160 MG tablet Take 1 tablet by mouth 2 (two) times daily. 09/21/16   Dianne Dun, MD  traMADol (ULTRAM) 50 MG tablet Take 1 tablet (50 mg total) by mouth every 8 (eight) hours as needed. 09/21/16   Dianne Dun, MD    Family History History reviewed. No pertinent family history.  Social History Social History  Substance Use Topics  . Smoking status: Former Smoker    Packs/day: 0.25    Types: Cigarettes  . Smokeless tobacco: Never Used  . Alcohol use No     Allergies   Patient has no known allergies.   Review of Systems Review of Systems  Constitutional: Negative for chills and fever.  HENT: Positive for facial swelling and sinus pain. Negative for dental problem, drooling, ear discharge, rhinorrhea and sore throat.   Respiratory: Negative for shortness of breath.   Cardiovascular: Negative for chest pain.  Gastrointestinal: Negative for abdominal pain, constipation and diarrhea.  Genitourinary: Negative for dysuria and  hematuria.  Musculoskeletal: Negative for arthralgias and myalgias.  Skin: Negative for color change.  Allergic/Immunologic: Negative for immunocompromised state.  Neurological: Negative for weakness and numbness.  Psychiatric/Behavioral: Negative for confusion.  All systems reviewed and are negative for acute change except as noted in  the HPI.    Physical Exam Updated Vital Signs BP (!) 146/102 (BP Location: Left Arm)   Pulse 78   Temp 98.4 F (36.9 C) (Oral)   Resp 18   Wt 253 lb 8 oz (115 kg)   SpO2 98%   BMI 35.86 kg/m   Physical Exam  Constitutional: He is oriented to person, place, and time. Vital signs are normal. He appears well-developed and well-nourished.  Non-toxic appearance. No distress.  Afebrile, nontoxic, NAD  HENT:  Head: Normocephalic and atraumatic.  Nose: Nose normal.  Mouth/Throat: Uvula is midline, oropharynx is clear and moist and mucous membranes are normal. No trismus in the jaw. Dental caries present. No dental abscesses or uvula swelling. Tonsils are 0 on the right. Tonsils are 0 on the left. No tonsillar exudate.  Mild left cheek swelling without erythema or warmth to the skin. Left upper teeth #11 and 13 decayed but without surrounding gingival erythema or swelling, no abscess palpated. No tenderness. No evidence of ludwig's. No induration or fluctuance to upper left gumline. Nose clear. Oropharynx clear and moist, without uvular swelling or deviation, no trismus or drooling, no tonsillar swelling or erythema, no exudates. No PTA.   Eyes: Conjunctivae and EOM are normal. Right eye exhibits no discharge. Left eye exhibits no discharge.  Neck: Normal range of motion. Neck supple.  Cardiovascular: Normal rate and intact distal pulses.   Pulmonary/Chest: Effort normal. No respiratory distress.  Abdominal: Normal appearance. He exhibits no distension.  Musculoskeletal: Normal range of motion.  Neurological: He is alert and oriented to person, place, and time. He has normal strength. No sensory deficit.  Skin: Skin is warm, dry and intact. No rash noted.  Psychiatric: He has a normal mood and affect.  Nursing note and vitals reviewed.    ED Treatments / Results  DIAGNOSTIC STUDIES:  Oxygen Saturation is 98% on RA, normal by my interpretation.    COORDINATION OF CARE:  3:52 PM  Discussed treatment plan with pt at bedside and pt agreed to plan.   Labs (all labs ordered are listed, but only abnormal results are displayed) Labs Reviewed - No data to display  EKG  EKG Interpretation None       Radiology Ct Maxillofacial W/cm  Result Date: 09/21/2016 CLINICAL DATA:  Left facial swelling since this morning. Developed sinus drainage, pressure and pharyngitis 3 days ago. Treated for sinus infection with antibiotics 3 weeks ago. Clinical concern for cellulitis. EXAM: CT MAXILLOFACIAL WITH CONTRAST TECHNIQUE: Multidetector CT imaging of the maxillofacial structures was performed. Multiplanar CT image reconstructions were also generated. A small metallic BB was placed on the right temple in order to reliably differentiate right from left. COMPARISON:  Frontal facial radiograph obtained elsewhere on 08/14/2006. FINDINGS: Osseous: There is periapical lucency involving the root of the left upper cuspid. That tooth as a cap with no visible cavity. There is a cavity in the adjacent left lateral incisor. Orbits: Unremarkable. Sinuses: Mild left maxillary sinus mucosal thickening. The remainder the paranasal sinuses are normally pneumatized with mucosal thickening or air-fluid levels. Soft tissues: Left anterior facial subcutaneous edema. There is an underlying fluid collection adjacent to the maxilla on the left, measuring 1.2 x 0.6 cm on image  number 34 of series 5. This measures 1.2 cm in length on sagittal image number 49 of series 10. This is adjacent to the periapical lucency involving the root of the left upper cuspid. Limited intracranial: Unremarkable. IMPRESSION: 1. Left facial cellulitis with an underlying 1.2 x 1.2 x 0.6 cm abscess adjacent to the periapical lucency involving the left upper cuspid. The lucency may represent a periapical abscess. 2. Cavity in the left upper lateral incisor. 3. Mild chronic left maxillary sinusitis. These results will be called to the ordering  clinician or representative by the Radiologist Assistant, and communication documented in the PACS or zVision Dashboard. Electronically Signed   By: Beckie Salts M.D.   On: 09/21/2016 14:33    Procedures Procedures (including critical care time)  Medications Ordered in ED Medications - No data to display   Initial Impression / Assessment and Plan / ED Course  I have reviewed the triage vital signs and the nursing notes.  Pertinent labs & imaging results that were available during my care of the patient were reviewed by me and considered in my medical decision making (see chart for details).     46 y.o. male here with L facial swelling onset today, had outpt CT that revealed small abscess around periapical area of L upper cuspid, was told by PCP to come here for ENT consultation for drainage. Has ENT appt tomorrow. On exam, no definite dental abscess appreciated, mild L cheek swelling, no induration/fluctuance to gumline, decayed teeth in that area present. Handling secretions well. Doubt need for emergent consultation, advised that he call his dentist today and schedule an appt ASAP, as well as f/up with ENT referral tomorrow. Will switch to doxycycline as this has better coverage for skin and oral flora compared to bactrim; advised pt not to start bactrim rx'd by PCP earlier. OTC remedies for pain control advised. F/up with ENT and dentistry tomorrow. I explained the diagnosis and have given explicit precautions to return to the ER including for any other new or worsening symptoms. The patient understands and accepts the medical plan as it's been dictated and I have answered their questions. Discharge instructions concerning home care and prescriptions have been given. The patient is STABLE and is discharged to home in good condition.   I personally performed the services described in this documentation, which was scribed in my presence. The recorded information has been reviewed and is accurate.     Final Clinical Impressions(s) / ED Diagnoses   Final diagnoses:  Facial cellulitis  Dental infection    New Prescriptions New Prescriptions   DOXYCYCLINE (VIBRAMYCIN) 100 MG CAPSULE    Take 1 capsule (100 mg total) by mouth 2 (two) times daily. One po bid x 7 days     [id number] Shakena Callari, PA-C 09/21/16 1611    Doug Sou, MD 09/22/16 [id number]

## 2016-09-21 NOTE — Addendum Note (Signed)
Addended by: Eual Fines on: 09/21/2016 12:10 PM   Modules accepted: Orders

## 2016-09-21 NOTE — Progress Notes (Signed)
Subjective:   Patient ID: Eric Avila, male    DOB: 1971-01-20, 46 y.o.   MRN: [id number]  Eric Avila is a pleasant 46 y.o. year old male pt of Dr. Patsy Lager, new to me, who presents to clinic today with Facial Swelling (Left cheek for several days. Started after symptoms of sinus pressure/congestion started. Saw Dr Milinda Antis 08-30-16 for same thing)  on 09/21/2016  HPI:  Saw Dr. Milinda Antis for this complaint on 08/29/16.  Note reviewed.  At that time, acute onset (the previous morning) of left eye and facial redness, swelling and tenderness. Had some nasal congestion as well.  On exam, Dr. Milinda Antis noticed sinus pressure with left facial swelling- placed him on Augmentin and doxycycline for double coverage for sinusitis but also to cover cellulitis.  Symptoms resolved.  Has had more nasal congestion for past few days.  Woke up with morning with acute onset of the left sided maxillary pain and swelling again- almost as severe as when he saw Dr. Milinda Antis.    NO fevers. No nausea or vomiting.  He does have a dull ache in this area has not progressed to more severe pain.  Ibuprofen is "no longer cutting it.".  No dental pain or issues.   Current Outpatient Prescriptions on File Prior to Visit  Medication Sig Dispense Refill  . ibuprofen (ADVIL,MOTRIN) 200 MG tablet Take 200 mg by mouth every 6 (six) hours as needed.    Marland Kitchen lisinopril (PRINIVIL,ZESTRIL) 20 MG tablet Take 1 tablet (20 mg total) by mouth daily. 30 tablet 3   No current facility-administered medications on file prior to visit.     No Known Allergies  Past Medical History:  Diagnosis Date  . Hypertension 08/29/2016  . Nephrolithiasis 12/26/2007   Qualifier: Diagnosis of  By: Jillyn Hidden FNP, Mcarthur Rossetti     No past surgical history on file.  No family history on file.  Social History   Social History  . Marital status: Married    Spouse name: N/A  . Number of children: N/A  . Years of education: N/A   Occupational  History  . Not on file.   Social History Main Topics  . Smoking status: Former Smoker    Packs/day: 0.25    Types: Cigarettes  . Smokeless tobacco: Never Used  . Alcohol use No  . Drug use: No  . Sexual activity: Not on file   Other Topics Concern  . Not on file   Social History Narrative  . No narrative on file   The PMH, PSH, Social History, Family History, Medications, and allergies have been reviewed in [facility], and have been updated if relevant.   Review of Systems  Constitutional: Negative for fever.  HENT: Positive for congestion, sinus pain and sinus pressure. Negative for trouble swallowing.   Respiratory: Negative.   Cardiovascular: Negative.   Gastrointestinal: Negative.   Musculoskeletal: Negative.   Neurological: Positive for headaches. Negative for dizziness, tremors, syncope and weakness.  Hematological: Negative.   Psychiatric/Behavioral: Negative.   All other systems reviewed and are negative.      Objective:    BP (!) 146/106 (BP Location: Right Arm, Patient Position: Sitting, Cuff Size: Large)   Pulse 81   Temp 98.6 F (37 C) (Oral)   Wt 254 lb (115.2 kg)   SpO2 96%   BMI 35.93 kg/m    Physical Exam  Constitutional: He is oriented to person, place, and time. He appears well-developed and well-nourished. No distress.  HENT:  Head:    Eyes: Conjunctivae are normal.  Cardiovascular: Normal rate.   Pulmonary/Chest: Effort normal.  Musculoskeletal: Normal range of motion.  Neurological: He is alert and oriented to person, place, and time. No cranial nerve deficit.  Skin: Skin is warm and dry. He is not diaphoretic.  Psychiatric: He has a normal mood and affect. His behavior is normal. Judgment and thought content normal.  Nursing note and vitals reviewed.         Assessment & Plan:   Facial swelling  Acute maxillary sinusitis, recurrence not specified No Follow-up on file.

## 2016-09-21 NOTE — ED Triage Notes (Addendum)
Pt c/o left facial swelling onset today on waking, sinus congestion x past few days. Had similar but worse swelling of face 2 weeks ago, diagnosed with sinus infection, treated with Amoxacillin and Doxycycline, finished last weekend. Pt has appointment with ENT tomorrow. No SOB, swelling in mouth or throat. Had CT of face today.

## 2016-09-21 NOTE — Progress Notes (Signed)
Pre visit review using our clinic review tool, if applicable. No additional management support is needed unless otherwise documented below in the visit note. 

## 2016-09-21 NOTE — Assessment & Plan Note (Signed)
Symptoms resolved with abx and has now re occurring which is concerning. Given IM rocephin and IM toradol for pain in office. Orders stat CT of maxilla/facial, stat CBC, CMET. Start oral Bactrim DS twice daily x 10 days. Given rx for tramadol to take as needed for severe pain. Discussed sedation precautions. Will also place ENT referral. The patient indicates understanding of these issues and agrees with the plan.

## 2016-09-21 NOTE — Patient Instructions (Signed)
Good to see you. Please stop by to see Eric Avila on your way out- she will schedule your CT scan and ENT appointment.  Take Bactrim as directed- 1 tablet twice daily for 10 days.  Tramadol as needed for pain- this may make you sleepy.  If anything worsens like we discussed, please go straight to the ER.

## 2016-09-21 NOTE — Discharge Instructions (Signed)
Apply warm compresses to jaw throughout the day. Take antibiotic Doxycycline until finished. DO NOT TAKE BACTRIM PRESCRIBED AT YOUR EARLIER APPOINTMENT. Alternate between tylenol and motrin as needed for pain, or use other pain medications given to you earlier at your other visit. Follow up with the ENT tomorrow for recheck of symptoms and ongoing management; also call your dentist and follow up with them as soon as possible. Return to emergency department for emergent changing or worsening symptoms.

## 2016-09-21 NOTE — ED Notes (Signed)
Bed: WTR6 Expected date:  Expected time:  Means of arrival:  Comments: 

## 2016-09-21 NOTE — Telephone Encounter (Signed)
Spoke to Dr Dayton Martes over the phone. She said to have the pt go to the ER to consult Oral Surgeon or ENT to drain the abscess. I called and spoke to the pt. He will have his wife take him to the ER now.

## 2016-11-28 ENCOUNTER — Other Ambulatory Visit: Payer: Self-pay | Admitting: Family Medicine

## 2016-11-28 DIAGNOSIS — Z114 Encounter for screening for human immunodeficiency virus [HIV]: Secondary | ICD-10-CM

## 2016-11-28 DIAGNOSIS — Z0000 Encounter for general adult medical examination without abnormal findings: Secondary | ICD-10-CM

## 2016-11-30 ENCOUNTER — Other Ambulatory Visit: Payer: 59

## 2016-12-04 ENCOUNTER — Encounter: Payer: Self-pay | Admitting: Family Medicine

## 2016-12-04 ENCOUNTER — Ambulatory Visit (INDEPENDENT_AMBULATORY_CARE_PROVIDER_SITE_OTHER): Payer: 59 | Admitting: Family Medicine

## 2016-12-04 VITALS — BP 138/90 | HR 71 | Temp 98.3°F | Ht 71.0 in | Wt 242.8 lb

## 2016-12-04 DIAGNOSIS — Z23 Encounter for immunization: Secondary | ICD-10-CM

## 2016-12-04 DIAGNOSIS — Z125 Encounter for screening for malignant neoplasm of prostate: Secondary | ICD-10-CM

## 2016-12-04 DIAGNOSIS — Z0000 Encounter for general adult medical examination without abnormal findings: Secondary | ICD-10-CM

## 2016-12-04 NOTE — Progress Notes (Signed)
Dr. Frederico Hamman T. Mickie Badders, MD, [facility] [place], [address] Phone: [phone] Fax: [phone]  12/04/2016  Patient: Eric Avila, MRN: [id number], DOB: Aug 10, 1970, 46 y.o.  Primary Physician:  Owens Loffler, MD   Chief Complaint  Patient presents with  . Annual Exam   Subjective:   Eric Avila is a 46 y.o. pleasant patient who presents with the following:  Preventative Health Maintenance Visit:  Health Maintenance Summary Reviewed and updated, unless pt declines services.  HTN: Tolerating all medications without side effects Stable and at goal No CP, no sob. No HA.  BP Readings from Last 3 Encounters:  12/04/16 138/90  09/21/16 (!) 146/102  09/21/16 (!) 062/376    Basic Metabolic Panel:    Component Value Date/Time   NA 139 09/21/2016 1133   K 4.1 09/21/2016 1133   CL 104 09/21/2016 1133   CO2 29 09/21/2016 1133   BUN 10 09/21/2016 1133   CREATININE 0.96 09/21/2016 1133   CREATININE 0.98 01/16/2014 1629   GLUCOSE 101 (H) 09/21/2016 1133   CALCIUM 9.0 09/21/2016 1133     Tobacco History Reviewed. Alcohol: No concerns, no excessive use Exercise Habits: Some activity, rec at least 30 mins 5 times a week STD concerns: no risk or activity to increase risk Drug Use: None Encouraged self-testicular check  Health Maintenance  Topic Date Due  . HIV Screening  08/22/1985  . TETANUS/TDAP  10/04/2015  . INFLUENZA VACCINE  01/03/2017   Immunization History  Administered Date(s) Administered  . Td 10/03/2005   Patient Active Problem List   Diagnosis Date Noted  . Obesity (BMI 30-39.9) 08/31/2016  . Hypertension 08/29/2016  . Nephrolithiasis 12/26/2007   Past Medical History:  Diagnosis Date  . Hypertension 08/29/2016  . Nephrolithiasis 12/26/2007   Qualifier: Diagnosis of  By: Maxie Better FNP, Rosalita Levan    No past surgical history on file. Social History   Social History  .  Marital status: Married    Spouse name: N/A  . Number of children: N/A  . Years of education: N/A   Occupational History  . Not on file.   Social History Main Topics  . Smoking status: Former Smoker    Packs/day: 0.25    Types: Cigarettes  . Smokeless tobacco: Never Used  . Alcohol use No  . Drug use: No  . Sexual activity: Not on file   Other Topics Concern  . Not on file   Social History Narrative  . No narrative on file   No family history on file. No Known Allergies  Medication list has been reviewed and updated.   General: Denies fever, chills, sweats. No significant weight loss. Eyes: Denies blurring,significant itching ENT: Denies earache, sore throat, and hoarseness. Cardiovascular: Denies chest pains, palpitations, dyspnea on exertion Respiratory: Denies cough, dyspnea at rest,wheeezing Breast: no concerns about lumps GI: Denies nausea, vomiting, diarrhea, constipation, change in bowel habits, abdominal pain, melena, hematochezia GU: Denies penile discharge, ED, urinary flow / outflow problems. No STD concerns. Musculoskeletal: Denies back pain, joint pain Derm: Denies rash, itching Neuro: Denies  paresthesias, frequent falls, frequent headaches Psych: Denies depression, anxiety Endocrine: Denies cold intolerance, heat intolerance, polydipsia Heme: Denies enlarged lymph nodes Allergy: No hayfever  Objective:   BP 138/90   Pulse 71   Temp 98.3 F (36.8 C) (Oral)   Ht _0  (1.803 m)   Wt 242 lb 12 oz (110.1 kg)   BMI 33.86  kg/m  Ideal Body Weight: Weight in (lb) to have BMI = 25: 178.9  No exam data present  GEN: well developed, well nourished, no acute distress Eyes: conjunctiva and lids normal, PERRLA, EOMI ENT: TM clear, nares clear, oral exam WNL Neck: supple, no lymphadenopathy, no thyromegaly, no JVD Pulm: clear to auscultation and percussion, respiratory effort normal CV: regular rate and rhythm, S1-S2, no murmur, rub or gallop, no  bruits, peripheral pulses normal and symmetric, no cyanosis, clubbing, edema or varicosities GI: soft, non-tender; no hepatosplenomegaly, masses; active bowel sounds all quadrants GU: no hernia, testicular mass, penile discharge Lymph: no cervical, axillary or inguinal adenopathy MSK: gait normal, muscle tone and strength WNL, no joint swelling, effusions, discoloration, crepitus  SKIN: clear, good turgor, color WNL, no rashes, lesions, or ulcerations Neuro: normal mental status, normal strength, sensation, and motion Psych: alert; oriented to person, place and time, normally interactive and not anxious or depressed in appearance. All labs reviewed with patient.  Lipids:    Component Value Date/Time   CHOL 197 01/16/2014 1629   TRIG 262 (H) 01/16/2014 1629   HDL 35 (L) 01/16/2014 1629   LDLDIRECT 127.0 12/26/2007 1519   VLDL 52 (H) 01/16/2014 1629   CHOLHDL 5.6 01/16/2014 1629   CBC: CBC Latest Ref Rng & Units 09/21/2016 01/16/2014  WBC 4.0 - 10.5 K/uL 6.2 8.0  Hemoglobin 13.0 - 17.0 g/dL 14.3 14.0  Hematocrit 39.0 - 52.0 % 43.4 39.6  Platelets 150.0 - 400.0 K/uL 200.0 778    Basic Metabolic Panel:    Component Value Date/Time   NA 139 09/21/2016 1133   K 4.1 09/21/2016 1133   CL 104 09/21/2016 1133   CO2 29 09/21/2016 1133   BUN 10 09/21/2016 1133   CREATININE 0.96 09/21/2016 1133   CREATININE 0.98 01/16/2014 1629   GLUCOSE 101 (H) 09/21/2016 1133   CALCIUM 9.0 09/21/2016 1133   Hepatic Function Latest Ref Rng & Units 09/21/2016 01/16/2014  Total Protein 6.0 - 8.3 g/dL 7.4 7.0  Albumin 3.5 - 5.2 g/dL 4.2 4.3  AST 0 - 37 U/L 22 18  ALT 0 - 53 U/L 25 23  Alk Phosphatase 39 - 117 U/L 64 68  Total Bilirubin 0.2 - 1.2 mg/dL 0.3 0.4    Lab Results  Component Value Date   TSH 2.01 12/26/2007   No results found for: PSA  Assessment and Plan:   Healthcare maintenance - Plan: PSA, Lipid panel, TSH  Screening PSA (prostate specific antigen) - Plan: PSA  Doing well, bp  better Tdap today  Health Maintenance Exam: The patient's preventative maintenance and recommended screening tests for an annual wellness exam were reviewed in full today. Brought up to date unless services declined.  Counselled on the importance of diet, exercise, and its role in overall health and mortality. The patient's FH and SH was reviewed, including their home life, tobacco status, and drug and alcohol status.  Follow-up in 1 year for physical exam or additional follow-up below.  Follow-up: No Follow-up on file. Or follow-up in 1 year if not noted.  No orders of the defined types were placed in this encounter.  Medications Discontinued During This Encounter  Medication Reason  . traMADol (ULTRAM) 50 MG tablet Completed Course  . sulfamethoxazole-trimethoprim (BACTRIM DS,SEPTRA DS) 800-160 MG tablet Completed Course  . doxycycline (VIBRAMYCIN) 100 MG capsule Completed Course   Orders Placed This Encounter  Procedures  . PSA  . Lipid panel  . TSH    Signed,  Waino Mounsey T. Keyshon Stein, MD   Allergies as of 12/04/2016   No Known Allergies     Medication List       Accurate as of 12/04/16  9:06 AM. Always use your most recent med list.          ibuprofen 200 MG tablet Commonly known as:  ADVIL,MOTRIN Take 200 mg by mouth every 6 (six) hours as needed.   lisinopril 20 MG tablet Commonly known as:  PRINIVIL,ZESTRIL Take 1 tablet (20 mg total) by mouth daily.

## 2016-12-04 NOTE — Addendum Note (Signed)
Addended by: Damita LackLORING, DONNA S on: 12/04/2016 09:12 AM   Modules accepted: Orders

## 2016-12-04 NOTE — Addendum Note (Signed)
Addended by: Alvina ChouWALSH, TERRI J on: 12/04/2016 12:28 PM   Modules accepted: Orders

## 2017-10-19 ENCOUNTER — Encounter: Payer: Self-pay | Admitting: Family Medicine

## 2017-11-20 ENCOUNTER — Encounter: Payer: Self-pay | Admitting: Family Medicine

## 2017-12-05 ENCOUNTER — Encounter: Payer: 59 | Admitting: Family Medicine

## 2019-01-01 ENCOUNTER — Encounter (HOSPITAL_BASED_OUTPATIENT_CLINIC_OR_DEPARTMENT_OTHER): Payer: Self-pay | Admitting: Emergency Medicine

## 2019-01-01 ENCOUNTER — Other Ambulatory Visit: Payer: Self-pay

## 2019-01-01 ENCOUNTER — Emergency Department (HOSPITAL_BASED_OUTPATIENT_CLINIC_OR_DEPARTMENT_OTHER): Payer: No Typology Code available for payment source

## 2019-01-01 ENCOUNTER — Emergency Department (HOSPITAL_BASED_OUTPATIENT_CLINIC_OR_DEPARTMENT_OTHER)
Admission: EM | Admit: 2019-01-01 | Discharge: 2019-01-01 | Disposition: A | Discharge status: Home / Self Care | Payer: No Typology Code available for payment source | Attending: Emergency Medicine | Admitting: Emergency Medicine

## 2019-01-01 DIAGNOSIS — S61032A Puncture wound without foreign body of left thumb without damage to nail, initial encounter: Secondary | ICD-10-CM | POA: Insufficient documentation

## 2019-01-01 DIAGNOSIS — Z79899 Other long term (current) drug therapy: Secondary | ICD-10-CM | POA: Diagnosis not present

## 2019-01-01 DIAGNOSIS — Y939 Activity, unspecified: Secondary | ICD-10-CM | POA: Diagnosis not present

## 2019-01-01 DIAGNOSIS — Z87891 Personal history of nicotine dependence: Secondary | ICD-10-CM | POA: Insufficient documentation

## 2019-01-01 DIAGNOSIS — W208XXA Other cause of strike by thrown, projected or falling object, initial encounter: Secondary | ICD-10-CM | POA: Diagnosis not present

## 2019-01-01 DIAGNOSIS — S6992XA Unspecified injury of left wrist, hand and finger(s), initial encounter: Secondary | ICD-10-CM | POA: Diagnosis present

## 2019-01-01 DIAGNOSIS — I10 Essential (primary) hypertension: Secondary | ICD-10-CM | POA: Insufficient documentation

## 2019-01-01 DIAGNOSIS — Y929 Unspecified place or not applicable: Secondary | ICD-10-CM | POA: Diagnosis not present

## 2019-01-01 DIAGNOSIS — Y999 Unspecified external cause status: Secondary | ICD-10-CM | POA: Insufficient documentation

## 2019-01-01 MED ORDER — CEPHALEXIN 500 MG PO CAPS: 500.0000 mg | ORAL_CAPSULE | Freq: Four times a day (QID) | ORAL | 0 refills | Status: DC

## 2019-01-01 MED FILL — CEPHALEXIN 500 MG CAPSULE: 500 | 7 days supply | Qty: 28 | Fill #0

## 2019-01-01 NOTE — ED Provider Notes (Signed)
[facility] DEPART[facility] Provider Note   CSN: [id number] Arrival date & time: 01/01/19  2725     History   Chief Complaint Chief Complaint  Patient presents with  . Hand Pain    HPI Eric Avila is a 48 y.o. male.     Patient is a 48 year old male with history of hypertension.  He presents with complaints of a left thumb injury.  Patient states that he was operating an air pressure hose when part of it broke off and shot pieces of plastic into his left thumb.  Patient states that he had a shard of plastic embedded in his left thumb just underneath the tip of the nail.  This was removed by him.  Last tetanus shot was 2016.  The history is provided by the patient.  Hand Pain This is a new problem. The problem occurs constantly. The problem has not changed since onset.Nothing aggravates the symptoms. Nothing relieves the symptoms. He has tried nothing for the symptoms.    Past Medical History:  Diagnosis Date  . Hypertension 08/29/2016  . Nephrolithiasis 12/26/2007   Qualifier: Diagnosis of  By: Maxie Better FNP, Rosalita Levan     Patient Active Problem List   Diagnosis Date Noted  . Obesity (BMI 30-39.9) 08/31/2016  . Hypertension 08/29/2016  . Nephrolithiasis 12/26/2007    History reviewed. No pertinent surgical history.      Home Medications    Prior to Admission medications   Medication Sig Start Date End Date Taking? Authorizing Provider  ibuprofen (ADVIL,MOTRIN) 200 MG tablet Take 200 mg by mouth every 6 (six) hours as needed.    [provider]  lisinopril (PRINIVIL,ZESTRIL) 20 MG tablet Take 1 tablet (20 mg total) by mouth daily. 08/31/16   Owens Loffler, MD    Family History No family history on file.  Social History Social History   Tobacco Use  . Smoking status: Former Smoker    Packs/day: 0.25    Types: Cigarettes  . Smokeless tobacco: Never Used  Substance Use Topics  . Alcohol use: No    Alcohol/week: 0.0  standard drinks  . Drug use: Yes    Types: Marijuana    Comment: for back pain     Allergies   Patient has no known allergies.   Review of Systems Review of Systems  All other systems reviewed and are negative.    Physical Exam Updated Vital Signs BP (!) 167/104 (BP Location: Right Arm)   Pulse 75   Temp 98.6 F (37 C) (Oral)   Wt 118 kg   SpO2 99%   BMI 36.28 kg/m   Physical Exam Vitals signs and nursing note reviewed.  Constitutional:      General: He is not in acute distress.    Appearance: Normal appearance. He is not ill-appearing.  Musculoskeletal: Normal range of motion.     Comments: The left thumb has a small puncture to the fingertip near the distal tip of the nail.  There is also a another small puncture to the finger pad of the left thumb.  Bleeding is controlled.  There is no significant swelling or edema.  Capillary refill is brisk.  Neurological:     General: No focal deficit present.     Mental Status: He is alert.      ED Treatments / Results  Labs (all labs ordered are listed, but only abnormal results are displayed) Labs Reviewed - No data to display  EKG None  Radiology No  results found.  Procedures Procedures (including critical care time)  Medications Ordered in ED Medications - No data to display   Initial Impression / Assessment and Plan / ED Course  I have reviewed the triage vital signs and the nursing notes.  Pertinent labs & imaging results that were available during my care of the patient were reviewed by me and considered in my medical decision making (see chart for details).  Patient presenting with a left thumb injury resulting from operating a high pressure air hose at work.  This caused a shard of plastic to become embedded in his thumb.  Patient removed the plastic and presents for evaluation of his injury.  On exam, there are 2 small punctures to the tip and finger pad of the left thumb.   X-rays show no evidence  for fracture, foreign body, or subcutaneous air.  The thumb is not significantly swollen and the tissues are soft.  He has good sensation and capillary refill to the tip of the thumb.  At this point, this will be treated with local wound care and follow-up as needed.  He will also be given Keflex.  Final Clinical Impressions(s) / ED Diagnoses   Final diagnoses:  None    ED Discharge Orders    None       Geoffery Lyonselo, Shandy Checo, MD 01/01/19 1041

## 2019-01-01 NOTE — ED Notes (Signed)
Pt soaking LT thumb in saline/iodine solution.

## 2019-01-01 NOTE — ED Triage Notes (Signed)
Pt with puncture wound after a piece of plastic was shot into LT thumb with air blower at "200 psi"; pt removed piece of plastic and has it with him

## 2019-01-01 NOTE — Discharge Instructions (Signed)
Keflex as prescribed.  Local wound care with bacitracin and dressing changes twice daily.  Return to the emergency department if you develop increased swelling, increased pain, purulent drainage, or other new and concerning symptoms.

## 2019-01-01 NOTE — ED Notes (Signed)
Patient transported to X-ray 

## 2019-03-10 ENCOUNTER — Encounter (HOSPITAL_BASED_OUTPATIENT_CLINIC_OR_DEPARTMENT_OTHER): Payer: Self-pay

## 2019-03-10 ENCOUNTER — Emergency Department (HOSPITAL_BASED_OUTPATIENT_CLINIC_OR_DEPARTMENT_OTHER): Payer: No Typology Code available for payment source

## 2019-03-10 ENCOUNTER — Other Ambulatory Visit: Payer: Self-pay

## 2019-03-10 ENCOUNTER — Emergency Department (HOSPITAL_BASED_OUTPATIENT_CLINIC_OR_DEPARTMENT_OTHER)
Admission: EM | Admit: 2019-03-10 | Discharge: 2019-03-10 | Disposition: A | Discharge status: Home / Self Care | Payer: No Typology Code available for payment source | Attending: Emergency Medicine | Admitting: Emergency Medicine

## 2019-03-10 DIAGNOSIS — Y9389 Activity, other specified: Secondary | ICD-10-CM | POA: Diagnosis not present

## 2019-03-10 DIAGNOSIS — Y998 Other external cause status: Secondary | ICD-10-CM | POA: Insufficient documentation

## 2019-03-10 DIAGNOSIS — W231XXA Caught, crushed, jammed, or pinched between stationary objects, initial encounter: Secondary | ICD-10-CM | POA: Diagnosis not present

## 2019-03-10 DIAGNOSIS — S62663A Nondisplaced fracture of distal phalanx of left middle finger, initial encounter for closed fracture: Secondary | ICD-10-CM | POA: Diagnosis not present

## 2019-03-10 DIAGNOSIS — Z87891 Personal history of nicotine dependence: Secondary | ICD-10-CM | POA: Insufficient documentation

## 2019-03-10 DIAGNOSIS — I10 Essential (primary) hypertension: Secondary | ICD-10-CM | POA: Diagnosis not present

## 2019-03-10 DIAGNOSIS — Z79899 Other long term (current) drug therapy: Secondary | ICD-10-CM | POA: Diagnosis not present

## 2019-03-10 DIAGNOSIS — S61313A Laceration without foreign body of left middle finger with damage to nail, initial encounter: Secondary | ICD-10-CM | POA: Insufficient documentation

## 2019-03-10 DIAGNOSIS — Y929 Unspecified place or not applicable: Secondary | ICD-10-CM | POA: Diagnosis not present

## 2019-03-10 DIAGNOSIS — F1210 Cannabis abuse, uncomplicated: Secondary | ICD-10-CM | POA: Diagnosis not present

## 2019-03-10 DIAGNOSIS — S6992XA Unspecified injury of left wrist, hand and finger(s), initial encounter: Secondary | ICD-10-CM | POA: Diagnosis present

## 2019-03-10 MED ORDER — LIDOCAINE HCL 1 % IJ SOLN
INTRAMUSCULAR | Status: AC
  Filled 2019-03-10: qty 20

## 2019-03-10 MED ORDER — TRAMADOL HCL 50 MG PO TABS: 50.0000 mg | ORAL_TABLET | Freq: Four times a day (QID) | ORAL | 0 refills | Status: DC | PRN

## 2019-03-10 MED ORDER — CEPHALEXIN 500 MG PO CAPS: 500.0000 mg | ORAL_CAPSULE | Freq: Four times a day (QID) | ORAL | 0 refills | Status: AC

## 2019-03-10 MED ORDER — HYDROCODONE-ACETAMINOPHEN 5-325 MG PO TABS
2.0000 | ORAL_TABLET | Freq: Once | ORAL | Status: AC
  Administered 2019-03-10: 17:00:00 2 via ORAL
  Filled 2019-03-10: qty 2

## 2019-03-10 MED ORDER — KETOROLAC TROMETHAMINE 30 MG/ML IJ SOLN
30.0000 mg | Freq: Once | INTRAMUSCULAR | Status: AC
  Administered 2019-03-10: 18:00:00 30 mg via INTRAVENOUS
  Filled 2019-03-10: qty 1

## 2019-03-10 NOTE — ED Notes (Signed)
Patient transported to X-ray 

## 2019-03-10 NOTE — ED Notes (Signed)
ED Provider at bedside for suturing.  

## 2019-03-10 NOTE — ED Triage Notes (Signed)
Pt was working on running vehicle, checking belt noise when something smashed right middle finger. Small lac noted on tip on finger, wrapped up with coban for bleeding control

## 2019-03-10 NOTE — ED Notes (Signed)
Pts hand soaking

## 2019-03-10 NOTE — ED Provider Notes (Signed)
[facility] EMERGENCY DEPARTMENT Provider Note   CSN: [id number] Arrival date & time: 03/10/19  1612     History   Chief Complaint Chief Complaint  Patient presents with  . Laceration    HPI Eric Avila is a 48 y.o. male no medical history presenting for right middle finger pain.  Pain is 3/10, constant, sharp, achy.   1 hour prior to arrival his middle finger slipped into a mechanical belt which crushed finger.  Patient states that he immediately withdrew the finger and saw a laceration with bleeding.  Patient states he applied pressure and wrapped this digit before presenting to ED.   Patient states tetanus last administered 1 year ago.  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     Denies any pain, abrasion or laceration elsewhere.  Denies any fever, shortness of breath, body aches, denies other pain.   HPI  Past Medical History:  Diagnosis Date  . Hypertension 08/29/2016  . Nephrolithiasis 12/26/2007   Qualifier: Diagnosis of  By: Jillyn Hidden FNP, Mcarthur Rossetti     Patient Active Problem List   Diagnosis Date Noted  . Obesity (BMI 30-39.9) 08/31/2016  . Hypertension 08/29/2016  . Nephrolithiasis 12/26/2007    History reviewed. No pertinent surgical history.      Home Medications    Prior to Admission medications   Medication Sig Start Date End Date Taking? Authorizing Provider  cephALEXin (KEFLEX) 500 MG capsule Take 1 capsule (500 mg total) by mouth 4 (four) times daily for 10 days. 03/10/19 03/20/19  Gailen Shelter, PA  ibuprofen (ADVIL,MOTRIN) 200 MG tablet Take 200 mg by mouth every 6 (six) hours as needed.    [provider]  lisinopril (PRINIVIL,ZESTRIL) 20 MG tablet Take 1 tablet (20 mg total) by mouth daily. 08/31/16   Copland, Karleen Hampshire, MD  traMADol (ULTRAM) 50 MG tablet Take 1 tablet (50 mg total) by mouth every 6 (six) hours as needed. 03/10/19   Gailen Shelter, PA    Family History History reviewed. No pertinent family  history.  Social History Social History   Tobacco Use  . Smoking status: Former Smoker    Packs/day: 0.25    Types: Cigarettes  . Smokeless tobacco: Never Used  Substance Use Topics  . Alcohol use: No    Alcohol/week: 0.0 standard drinks  . Drug use: Yes    Types: Marijuana    Comment: for back pain     Allergies   Patient has no known allergies.   Review of Systems Review of Systems  All other systems reviewed and are negative.    Physical Exam Updated Vital Signs BP (!) 174/96 (BP Location: Left Arm)   Pulse 68   Temp 98 F (36.7 C) (Oral)   Resp 20   Ht 6\' 1"  (1.854 m)   Wt 117.9 kg   SpO2 98%   BMI 34.30 kg/m   Physical Exam Vitals signs and nursing note reviewed.  Constitutional:      General: He is not in acute distress.    Appearance: Normal appearance. He is not ill-appearing.  HENT:     Head: Normocephalic and atraumatic.  Eyes:     General: No scleral icterus.       Right eye: No discharge.        Left eye: No discharge.     Conjunctiva/sclera: Conjunctivae normal.  Pulmonary:     Effort: Pulmonary effort is normal.     Breath sounds: No stridor.  Musculoskeletal:  Comments: Able to flex and extend finger against resistance. Cap refill less than 2   Skin:    Comments: 1.5 cm laceration to the radial aspect of the right middle finger.  No nail involvement. Moderate bleeding.  No bruising.  No exposed bone.  Neurological:     Mental Status: He is alert and oriented to person, place, and time. Mental status is at baseline.          ED Treatments / Results  Labs (all labs ordered are listed, but only abnormal results are displayed) Labs Reviewed - No data to display  EKG None  Radiology Dg Finger Middle Right  Result Date: 03/10/2019 CLINICAL DATA:  Crush injury of the third phalanx EXAM: RIGHT MIDDLE FINGER 2+V COMPARISON:  None. FINDINGS: Oblique nondisplaced fracture through the tuft of third distal phalanx. No other  fracture or dislocation. No aggressive osseous lesion. Soft tissue swelling of the tip of the third phalanx. IMPRESSION: Acute oblique nondisplaced fracture through the tuft of third distal phalanx. Electronically Signed   By: Kathreen Devoid   On: 03/10/2019 17:30    Procedures .Marland KitchenLaceration Repair  Date/Time: 03/11/2019 12:43 AM Performed by: Tedd Sias, PA Authorized by: Tedd Sias, PA   Consent:    Consent obtained:  Verbal   Consent given by:  Patient   Risks discussed:  Infection, need for additional repair, pain, poor cosmetic result and poor wound healing   Alternatives discussed:  No treatment and delayed treatment Universal protocol:    Procedure explained and questions answered to patient or proxy's satisfaction: yes     Relevant documents present and verified: yes     Test results available and properly labeled: yes     Imaging studies available: yes     Required blood products, implants, devices, and special equipment available: yes     Site/side marked: yes     Immediately prior to procedure, a time out was called: yes     Patient identity confirmed:  Verbally with patient Anesthesia (see MAR for exact dosages):    Anesthesia method:  Nerve block   Block needle gauge:  27 G   Block anesthetic:  Lidocaine 1% WITH epi   Block injection procedure:  Anatomic landmarks identified, introduced needle and negative aspiration for blood   Block outcome:  Anesthesia achieved Laceration details:    Location:  Finger   Finger location:  R long finger   Length (cm):  1.5 Pre-procedure details:    Preparation:  Patient was prepped and draped in usual sterile fashion Exploration:    Hemostasis achieved with:  Direct pressure   Wound exploration: wound explored through full range of motion   Treatment:    Area cleansed with:  Saline   Amount of cleaning:  Standard   Irrigation solution:  Sterile saline   Irrigation method:  Pressure wash   Visualized foreign  bodies/material removed: no   Skin repair:    Repair method:  Sutures   Suture size:  5-0   Suture material:  Prolene   Suture technique:  Simple interrupted   Number of sutures:  5 Approximation:    Approximation:  Close Post-procedure details:    Dressing:  Antibiotic ointment and non-adherent dressing   Patient tolerance of procedure:  Tolerated well, no immediate complications   (including critical care time)  Medications Ordered in ED Medications  HYDROcodone-acetaminophen (NORCO/VICODIN) 5-325 MG per tablet 2 tablet (2 tablets Oral Given 03/10/19 1711)  ketorolac (TORADOL) 30 MG/ML  injection 30 mg (30 mg Intravenous Given 03/10/19 1825)     Initial Impression / Assessment and Plan / ED Course  I have reviewed the triage vital signs and the nursing notes.  Pertinent labs & imaging results that were available during my care of the patient were reviewed by me and considered in my medical decision making (see chart for details).        Patient is 48 year old male presenting with right middle finger pain after crush injury with associated laceration.  I reviewed plain film x-ray of right middle finger which shows fracture of the distal phalanx.   Discussed case with Dr. Katherine Roanonley hand surgeon.  Recommended antibiotic, splint, copious irrigation, follow-up in clinic.  Patient will call Dr. Katherine Roanonley in the morning to make a appointment.  Patient provided with Keflex for antibiotic prophylaxis.  Wound was vigorously irrigated and cleaned.  Patient placed in splint.  Tramadol for pain.  Given strict return precautions include signs of infection.  Discussed wound care patient.  Discussed importance of following up with Dr. Katherine Roanonley due to open fracture within distal phalanx.   Final Clinical Impressions(s) / ED Diagnoses   Final diagnoses:  Laceration of left middle finger without foreign body with damage to nail, initial encounter  Closed nondisplaced fracture of distal phalanx of  left middle finger, initial encounter    ED Discharge Orders         Ordered    cephALEXin (KEFLEX) 500 MG capsule  4 times daily     03/10/19 2008    traMADol (ULTRAM) 50 MG tablet  Every 6 hours PRN     03/10/19 2008           Solon AugustaFondaw, Inaara Tye [place], [place] 03/11/19 0053    Virgina Norfolkuratolo, Adam, DO 03/11/19 1715

## 2019-03-10 NOTE — Discharge Instructions (Signed)
Call Dr. Lenon Curt for follow-up in his office.  Please return if any new or concerning symptoms occur in the meantime.  Please use pain medication as prescribed only as needed.  Pain medication should not be mixed with alcohol and you should not operate heavy machinery.  Keep splint on finger until follow-up appointment with Dr. Lenon Curt.

## 2019-03-24 ENCOUNTER — Ambulatory Visit (INDEPENDENT_AMBULATORY_CARE_PROVIDER_SITE_OTHER): Payer: PRIVATE HEALTH INSURANCE | Admitting: Family Medicine

## 2019-03-24 ENCOUNTER — Other Ambulatory Visit: Payer: Self-pay

## 2019-03-24 ENCOUNTER — Encounter: Payer: Self-pay | Admitting: Family Medicine

## 2019-03-24 VITALS — BP 170/100 | HR 91 | Temp 98.7°F | Ht 70.5 in | Wt 255.5 lb

## 2019-03-24 DIAGNOSIS — E669 Obesity, unspecified: Secondary | ICD-10-CM

## 2019-03-24 DIAGNOSIS — I10 Essential (primary) hypertension: Secondary | ICD-10-CM

## 2019-03-24 MED ORDER — AMLODIPINE BESYLATE 10 MG PO TABS: 10.0000 mg | ORAL_TABLET | Freq: Every day | ORAL | 3 refills | Status: DC

## 2019-03-24 NOTE — Progress Notes (Signed)
Raynesha Tiedt T. Thersa Mohiuddin, MD Primary Care and Sports Medicine [facility] at [facility] [address] [place], [address] Phone: [phone]  FAX: [phone]  Eric Avila - 48 y.o. male  MRN [id number]  Date of Birth: 10-Sep-1970  Visit Date: 03/24/2019  PCP: Hannah Beat, MD  Referred by: Hannah Beat, MD  Chief Complaint  Patient presents with  . Hypertension   Subjective:   Eric Avila is a 48 y.o. very pleasant male patient who presents with the following:  His blood pressure is quite bad.  While he was in the ER it was greater than 200 systolic.  Today in the office it is 170/100.  I had given him some lisinopril a couple of years ago, but he ultimately stopped this and thought that it made him feel bad.  He is very open to starting and maintaining blood pressure medication and he is concerned that he could have some cardiovascular risk as well as renal risk.  Here for follow-up for BP. Started lisinopril a few years ago.   Wt Readings from Last 3 Encounters:  03/24/19 255 lb 8 oz (115.9 kg)  03/10/19 260 lb (117.9 kg)  01/01/19 260 lb 2.3 oz (118 kg)    BP Readings from Last 3 Encounters:  03/24/19 (!) 170/100  03/10/19 (!) 174/96  01/01/19 (!) 167/104    200's in the ER  Past Medical History, Surgical History, Social History, Family History, Problem List, Medications, and Allergies have been reviewed and updated if relevant.  Patient Active Problem List   Diagnosis Date Noted  . Obesity (BMI 30-39.9) 08/31/2016  . Hypertension 08/29/2016  . Nephrolithiasis 12/26/2007    Past Medical History:  Diagnosis Date  . Hypertension 08/29/2016  . Nephrolithiasis 12/26/2007   Qualifier: Diagnosis of  By: Jillyn Hidden FNP, Mcarthur Rossetti     History reviewed. No pertinent surgical history.  Social History   Socioeconomic History  . Marital status: Married    Spouse name: Not on file  . Number of children: Not on file  .  Years of education: Not on file  . Highest education level: Not on file  Occupational History  . Not on file  Social Needs  . Financial resource strain: Not on file  . Food insecurity    Worry: Not on file    Inability: Not on file  . Transportation needs    Medical: Not on file    Non-medical: Not on file  Tobacco Use  . Smoking status: Former Smoker    Packs/day: 0.25    Types: Cigarettes  . Smokeless tobacco: Never Used  Substance and Sexual Activity  . Alcohol use: No    Alcohol/week: 0.0 standard drinks  . Drug use: Yes    Types: Marijuana    Comment: for back pain  . Sexual activity: Not on file  Lifestyle  . Physical activity    Days per week: Not on file    Minutes per session: Not on file  . Stress: Not on file  Relationships  . Social Musician on phone: Not on file    Gets together: Not on file    Attends religious service: Not on file    Active member of club or organization: Not on file    Attends meetings of clubs or organizations: Not on file    Relationship status: Not on file  . Intimate partner violence    Fear of current or ex  partner: Not on file    Emotionally abused: Not on file    Physically abused: Not on file    Forced sexual activity: Not on file  Other Topics Concern  . Not on file  Social History Narrative  . Not on file    History reviewed. No pertinent family history.  No Known Allergies  Medication list reviewed and updated in full in [place].   GEN: No acute illnesses, no fevers, chills. GI: No n/v/d, eating normally Pulm: No SOB Interactive and getting along well at home.  Otherwise, ROS is as per the HPI.  Objective:   BP (!) 170/100   Pulse 91   Temp 98.7 F (37.1 C) (Temporal)   Ht 5' 10.5" (1.791 m)   Wt 255 lb 8 oz (115.9 kg)   SpO2 98%   BMI 36.14 kg/m   GEN: WDWN, NAD, Non-toxic, A & O x 3 HEENT: Atraumatic, Normocephalic. Neck supple. No masses, No LAD. Ears and Nose: No external  deformity. CV: RRR, No M/G/R. No JVD. No thrill. No extra heart sounds. PULM: CTA B, no wheezes, crackles, rhonchi. No retractions. No resp. distress. No accessory muscle use. EXTR: No c/c/e NEURO Normal gait.  PSYCH: Normally interactive. Conversant. Not depressed or anxious appearing.  Calm demeanor.   Laboratory and Imaging Data:  Assessment and Plan:     ICD-10-CM   1. Essential hypertension  I10   2. Obesity (BMI 30-39.9)  E66.9    With his goal to minimize side effects, I am in a place him on 10 mg of Norvasc with close follow-up.  Hopefully this will get his blood pressure under control, but he may need to have a second agent to achieve normalcy.  Follow-up: Return in about 1 month (around 04/24/2019).  Meds ordered this encounter  Medications  . amLODipine (NORVASC) 10 MG tablet    Sig: Take 1 tablet (10 mg total) by mouth daily.    Dispense:  30 tablet    Refill:  3   No orders of the defined types were placed in this encounter.   Signed,  Maud Deed. Sunshine Mackowski, MD   Outpatient Encounter Medications as of 03/24/2019  Medication Sig  . cephALEXin (KEFLEX) 500 MG capsule Take 500 mg by mouth 4 (four) times daily.  Marland Kitchen ibuprofen (ADVIL,MOTRIN) 200 MG tablet Take 200 mg by mouth every 6 (six) hours as needed.  Marland Kitchen amLODipine (NORVASC) 10 MG tablet Take 1 tablet (10 mg total) by mouth daily.  Marland Kitchen lisinopril (PRINIVIL,ZESTRIL) 20 MG tablet Take 1 tablet (20 mg total) by mouth daily. (Patient not taking: Reported on 03/24/2019)  . [DISCONTINUED] traMADol (ULTRAM) 50 MG tablet Take 1 tablet (50 mg total) by mouth every 6 (six) hours as needed.   No facility-administered encounter medications on file as of 03/24/2019.

## 2019-04-24 ENCOUNTER — Ambulatory Visit (INDEPENDENT_AMBULATORY_CARE_PROVIDER_SITE_OTHER): Payer: PRIVATE HEALTH INSURANCE | Admitting: Family Medicine

## 2019-04-24 ENCOUNTER — Other Ambulatory Visit: Payer: Self-pay

## 2019-04-24 ENCOUNTER — Encounter: Payer: Self-pay | Admitting: Family Medicine

## 2019-04-24 VITALS — BP 142/90 | HR 80 | Temp 98.3°F | Ht 70.5 in | Wt 245.8 lb

## 2019-04-24 DIAGNOSIS — I10 Essential (primary) hypertension: Secondary | ICD-10-CM

## 2019-04-24 MED ORDER — AMLODIPINE BESYLATE 10 MG PO TABS: 10.0000 mg | ORAL_TABLET | Freq: Every day | ORAL | 3 refills | Status: DC

## 2019-04-24 NOTE — Progress Notes (Signed)
Eric Avila T. Eric Dunleavy, MD Primary Care and Sports Medicine [facility] at [facility] [address] [place], [address] Phone: [phone]  FAX: [phone]  Eric Avila - 48 y.o. male  MRN [id number]  Date of Birth: April 25, 1971  Visit Date: 04/24/2019  PCP: Hannah Beat, MD  Referred by: Hannah Beat, MD  Chief Complaint  Patient presents with  . Follow-up    HTN   Subjective:   Eric Avila is a 48 y.o. very pleasant male patient who presents with the following:  He is here in follow-up regarding his high blood pressure.  Initially when I saw him in early October his blood pressure was over 200 systolic in the ER, and he was 170/100 in our office.At that point I placed him on some Norvasc at 10 mg with close follow-up  He is really doing a lot better and he is not having any headaches at all.  Feels much better after going on his BP meds  HTN:  142/90.  80's diastolic  135/80  Watch and weight loss.    Past Medical History, Surgical History, Social History, Family History, Problem List, Medications, and Allergies have been reviewed and updated if relevant.  Patient Active Problem List   Diagnosis Date Noted  . Obesity (BMI 30-39.9) 08/31/2016  . Hypertension 08/29/2016  . Nephrolithiasis 12/26/2007    Past Medical History:  Diagnosis Date  . Hypertension 08/29/2016  . Nephrolithiasis 12/26/2007   Qualifier: Diagnosis of  By: Jillyn Hidden FNP, Mcarthur Rossetti     History reviewed. No pertinent surgical history.  Social History   Socioeconomic History  . Marital status: Married    Spouse name: Not on file  . Number of children: Not on file  . Years of education: Not on file  . Highest education level: Not on file  Occupational History  . Not on file  Social Needs  . Financial resource strain: Not on file  . Food insecurity    Worry: Not on file    Inability: Not on file  . Transportation needs    Medical: Not on  file    Non-medical: Not on file  Tobacco Use  . Smoking status: Former Smoker    Packs/day: 0.25    Types: Cigarettes  . Smokeless tobacco: Never Used  Substance and Sexual Activity  . Alcohol use: No    Alcohol/week: 0.0 standard drinks  . Drug use: Yes    Types: Marijuana    Comment: for back pain  . Sexual activity: Not on file  Lifestyle  . Physical activity    Days per week: Not on file    Minutes per session: Not on file  . Stress: Not on file  Relationships  . Social Musician on phone: Not on file    Gets together: Not on file    Attends religious service: Not on file    Active member of club or organization: Not on file    Attends meetings of clubs or organizations: Not on file    Relationship status: Not on file  . Intimate partner violence    Fear of current or ex partner: Not on file    Emotionally abused: Not on file    Physically abused: Not on file    Forced sexual activity: Not on file  Other Topics Concern  . Not on file  Social History Narrative  . Not on file    History reviewed. No  pertinent family history.  No Known Allergies  Medication list reviewed and updated in full in [place].   GEN: No acute illnesses, no fevers, chills. GI: No n/v/d, eating normally Pulm: No SOB Interactive and getting along well at home.  Otherwise, ROS is as per the HPI.  Objective:   BP (!) 142/90   Pulse 80   Temp 98.3 F (36.8 C) (Temporal)   Ht 5' 10.5" (1.791 m)   Wt 245 lb 12 oz (111.5 kg)   SpO2 97%   BMI 34.76 kg/m   GEN: WDWN, NAD, Non-toxic, A & O x 3 HEENT: Atraumatic, Normocephalic. Neck supple. No masses, No LAD. Ears and Nose: No external deformity. EXTR: No c/c/e NEURO Normal gait.  PSYCH: Normally interactive. Conversant. Not depressed or anxious appearing.  Calm demeanor.   Laboratory and Imaging Data:  Assessment and Plan:     ICD-10-CM   1. Essential hypertension  I10    HTN and symptoms much improved,  work on wt loss.  F/u at [facility]  Follow-up: No follow-ups on file.  Meds ordered this encounter  Medications  . amLODipine (NORVASC) 10 MG tablet    Sig: Take 1 tablet (10 mg total) by mouth daily.    Dispense:  90 tablet    Refill:  3   No orders of the defined types were placed in this encounter.   Signed,  Maud Deed. Marshal Eskew, MD   Outpatient Encounter Medications as of 04/24/2019  Medication Sig  . amLODipine (NORVASC) 10 MG tablet Take 1 tablet (10 mg total) by mouth daily.  Marland Kitchen ibuprofen (ADVIL,MOTRIN) 200 MG tablet Take 200 mg by mouth every 6 (six) hours as needed.  . [DISCONTINUED] amLODipine (NORVASC) 10 MG tablet Take 1 tablet (10 mg total) by mouth daily.  . [DISCONTINUED] cephALEXin (KEFLEX) 500 MG capsule Take 500 mg by mouth 4 (four) times daily.  . [DISCONTINUED] lisinopril (PRINIVIL,ZESTRIL) 20 MG tablet Take 1 tablet (20 mg total) by mouth daily. (Patient not taking: Reported on 03/24/2019)   No facility-administered encounter medications on file as of 04/24/2019.

## 2019-11-03 ENCOUNTER — Emergency Department (HOSPITAL_COMMUNITY)
Admission: EM | Admit: 2019-11-03 | Discharge: 2019-11-03 | Disposition: A | Discharge status: Home / Self Care | Payer: No Typology Code available for payment source | Attending: Emergency Medicine | Admitting: Emergency Medicine

## 2019-11-03 ENCOUNTER — Emergency Department (HOSPITAL_COMMUNITY): Payer: No Typology Code available for payment source

## 2019-11-03 ENCOUNTER — Encounter (HOSPITAL_COMMUNITY): Payer: Self-pay

## 2019-11-03 ENCOUNTER — Other Ambulatory Visit: Payer: Self-pay

## 2019-11-03 DIAGNOSIS — Y9289 Other specified places as the place of occurrence of the external cause: Secondary | ICD-10-CM | POA: Insufficient documentation

## 2019-11-03 DIAGNOSIS — Y939 Activity, unspecified: Secondary | ICD-10-CM | POA: Diagnosis not present

## 2019-11-03 DIAGNOSIS — Y990 Civilian activity done for income or pay: Secondary | ICD-10-CM | POA: Insufficient documentation

## 2019-11-03 DIAGNOSIS — T1490XA Injury, unspecified, initial encounter: Secondary | ICD-10-CM

## 2019-11-03 DIAGNOSIS — I10 Essential (primary) hypertension: Secondary | ICD-10-CM | POA: Diagnosis not present

## 2019-11-03 DIAGNOSIS — S3992XA Unspecified injury of lower back, initial encounter: Secondary | ICD-10-CM | POA: Diagnosis present

## 2019-11-03 DIAGNOSIS — S301XXA Contusion of abdominal wall, initial encounter: Secondary | ICD-10-CM

## 2019-11-03 LAB — CBC
HCT: 41.6 % (ref 39.0–52.0)
Hemoglobin: 14 g/dL (ref 13.0–17.0)
MCH: 31.3 pg (ref 26.0–34.0)
MCHC: 33.7 g/dL (ref 30.0–36.0)
MCV: 92.9 fL (ref 80.0–100.0)
Platelets: 280 10*3/uL (ref 150–400)
RBC: 4.48 MIL/uL (ref 4.22–5.81)
RDW: 12.8 % (ref 11.5–15.5)
WBC: 8.9 10*3/uL (ref 4.0–10.5)
nRBC: 0 % (ref 0.0–0.2)

## 2019-11-03 LAB — BASIC METABOLIC PANEL
Anion gap: 15 (ref 5–15)
BUN: 15 mg/dL (ref 6–20)
CO2: 23 mmol/L (ref 22–32)
Calcium: 9.2 mg/dL (ref 8.9–10.3)
Chloride: 102 mmol/L (ref 98–111)
Creatinine, Ser: 0.91 mg/dL (ref 0.61–1.24)
GFR calc Af Amer: >60 mL/min (ref >60)
GFR calc non Af Amer: >60 mL/min (ref >60)
Glucose, Bld: 102 mg/dL — ABNORMAL HIGH (ref 70–99)
Potassium: 3.9 mmol/L (ref 3.5–5.1)
Sodium: 140 mmol/L (ref 135–145)

## 2019-11-03 MED ORDER — FENTANYL CITRATE (PF) 100 MCG/2ML IJ SOLN
INTRAMUSCULAR | Status: AC
  Filled 2019-11-03: qty 2

## 2019-11-03 MED ORDER — HYDROCODONE-ACETAMINOPHEN 5-325 MG PO TABS: 1.0000 | ORAL_TABLET | Freq: Four times a day (QID) | ORAL | 0 refills | Status: DC | PRN

## 2019-11-03 MED ORDER — FENTANYL CITRATE (PF) 100 MCG/2ML IJ SOLN
50.0000 ug | Freq: Once | INTRAMUSCULAR | Status: AC
  Administered 2019-11-03: 50 ug via INTRAVENOUS

## 2019-11-03 MED ORDER — CYCLOBENZAPRINE HCL 10 MG PO TABS
10.0000 mg | ORAL_TABLET | Freq: Two times a day (BID) | ORAL | 0 refills | Status: DC | PRN
Start: 2019-11-03 — End: 2020-08-04

## 2019-11-03 MED ORDER — IOHEXOL 300 MG/ML  SOLN
100.0000 mL | Freq: Once | INTRAMUSCULAR | Status: AC | PRN
  Administered 2019-11-03: 100 mL via INTRAVENOUS

## 2019-11-03 NOTE — ED Notes (Signed)
Patient verbalizes understanding of discharge instructions. Opportunity for questioning and answers were provided. Armband removed by staff, pt discharged from ED and ambulated to lobby to leave to go home with wife.
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
## 2019-11-03 NOTE — Discharge Instructions (Addendum)
Please read instructions below. Apply ice to your back for 20 minutes at a time. You can take ibuprofen every 6 hours as needed for pain. You can take the muscle relaxer, Flexeril, every 12 hours for muscle spasm. You can take the hydrocodone every 6 hours for severe/breakthrough pain.  Only take this if needed. Schedule an appointment with your primary care follow-up on your injury. Return to the ER for new or concerning symptoms.

## 2019-11-04 ENCOUNTER — Encounter: Payer: Self-pay | Admitting: Family Medicine
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
## 2020-04-26 ENCOUNTER — Other Ambulatory Visit: Payer: Self-pay | Admitting: Family Medicine

## 2020-07-28 ENCOUNTER — Other Ambulatory Visit: Payer: Self-pay

## 2020-07-28 ENCOUNTER — Other Ambulatory Visit (INDEPENDENT_AMBULATORY_CARE_PROVIDER_SITE_OTHER): Payer: PRIVATE HEALTH INSURANCE

## 2020-07-28 DIAGNOSIS — Z131 Encounter for screening for diabetes mellitus: Secondary | ICD-10-CM | POA: Diagnosis not present

## 2020-07-28 DIAGNOSIS — Z13220 Encounter for screening for lipoid disorders: Secondary | ICD-10-CM | POA: Diagnosis not present

## 2020-07-28 DIAGNOSIS — R5383 Other fatigue: Secondary | ICD-10-CM | POA: Diagnosis not present

## 2020-07-28 DIAGNOSIS — Z125 Encounter for screening for malignant neoplasm of prostate: Secondary | ICD-10-CM

## 2020-07-28 LAB — CBC WITH DIFFERENTIAL/PLATELET
Basophils Absolute: 0 10*3/uL (ref 0.0–0.1)
Basophils Relative: 0.7 % (ref 0.0–3.0)
Eosinophils Absolute: 0.1 10*3/uL (ref 0.0–0.7)
Eosinophils Relative: 2.1 % (ref 0.0–5.0)
HCT: 43.6 % (ref 39.0–52.0)
Hemoglobin: 14.8 g/dL (ref 13.0–17.0)
Lymphocytes Relative: 32.1 % (ref 12.0–46.0)
Lymphs Abs: 1.8 10*3/uL (ref 0.7–4.0)
MCHC: 33.8 g/dL (ref 30.0–36.0)
MCV: 89.6 fl (ref 78.0–100.0)
Monocytes Absolute: 0.6 10*3/uL (ref 0.1–1.0)
Monocytes Relative: 10.1 % (ref 3.0–12.0)
Neutro Abs: 3.1 10*3/uL (ref 1.4–7.7)
Neutrophils Relative %: 55 % (ref 43.0–77.0)
Platelets: 220 10*3/uL (ref 150.0–400.0)
RBC: 4.87 Mil/uL (ref 4.22–5.81)
RDW: 14.3 % (ref 11.5–15.5)
WBC: 5.7 10*3/uL (ref 4.0–10.5)

## 2020-07-28 LAB — BASIC METABOLIC PANEL
BUN: 11 mg/dL (ref 6–23)
CO2: 31 mEq/L (ref 19–32)
Calcium: 9.3 mg/dL (ref 8.4–10.5)
Chloride: 103 mEq/L (ref 96–112)
Creatinine, Ser: 0.92 mg/dL (ref 0.40–1.50)
GFR: 97.39 mL/min (ref >60.00)
Glucose, Bld: 109 mg/dL — ABNORMAL HIGH (ref 70–99)
Potassium: 4.7 mEq/L (ref 3.5–5.1)
Sodium: 137 mEq/L (ref 135–145)

## 2020-07-28 LAB — LIPID PANEL
Cholesterol: 210 mg/dL — ABNORMAL HIGH (ref 0–200)
HDL: 45.4 mg/dL (ref >39.00)
LDL Cholesterol: 145 mg/dL — ABNORMAL HIGH (ref 0–99)
NonHDL: 165.09
Total CHOL/HDL Ratio: 5
Triglycerides: 102 mg/dL (ref 0.0–149.0)
VLDL: 20.4 mg/dL (ref 0.0–40.0)

## 2020-07-28 LAB — HEPATIC FUNCTION PANEL
ALT: 17 U/L (ref 0–53)
AST: 14 U/L (ref 0–37)
Albumin: 4.2 g/dL (ref 3.5–5.2)
Alkaline Phosphatase: 73 U/L (ref 39–117)
Bilirubin, Direct: 0.1 mg/dL (ref 0.0–0.3)
Total Bilirubin: 0.7 mg/dL (ref 0.2–1.2)
Total Protein: 7.2 g/dL (ref 6.0–8.3)

## 2020-07-28 LAB — HEMOGLOBIN A1C: Hgb A1c MFr Bld: 6.2 % (ref 4.6–6.5)

## 2020-07-28 NOTE — Addendum Note (Signed)
Addended by: Aquilla Solian on: 07/28/2020 09:42 AM   Modules accepted: Orders

## 2020-07-29 LAB — PSA, TOTAL WITH REFLEX TO PSA, FREE: PSA, Total: 0.2 ng/mL (ref <=4.0)

## 2020-08-02 ENCOUNTER — Other Ambulatory Visit: Payer: Self-pay | Admitting: Family Medicine

## 2020-08-02 DIAGNOSIS — Z13220 Encounter for screening for lipoid disorders: Secondary | ICD-10-CM

## 2020-08-02 DIAGNOSIS — Z125 Encounter for screening for malignant neoplasm of prostate: Secondary | ICD-10-CM

## 2020-08-02 DIAGNOSIS — Z79899 Other long term (current) drug therapy: Secondary | ICD-10-CM

## 2020-08-02 DIAGNOSIS — Z131 Encounter for screening for diabetes mellitus: Secondary | ICD-10-CM

## 2020-08-04 ENCOUNTER — Encounter: Payer: Self-pay | Admitting: Family Medicine

## 2020-08-04 ENCOUNTER — Other Ambulatory Visit: Payer: Self-pay

## 2020-08-04 ENCOUNTER — Ambulatory Visit (INDEPENDENT_AMBULATORY_CARE_PROVIDER_SITE_OTHER): Payer: PRIVATE HEALTH INSURANCE | Admitting: Family Medicine

## 2020-08-04 VITALS — BP 150/90 | HR 91 | Temp 97.3°F | Ht 70.5 in | Wt 251.5 lb

## 2020-08-04 DIAGNOSIS — Z0000 Encounter for general adult medical examination without abnormal findings: Secondary | ICD-10-CM

## 2020-08-04 DIAGNOSIS — Z1211 Encounter for screening for malignant neoplasm of colon: Secondary | ICD-10-CM

## 2020-08-04 NOTE — Progress Notes (Signed)
Faline Langer T. Dick Hark, MD, CAQ Sports Medicine  Primary Care and Sports Medicine [facility] at [facility] [address] [place], [address]  Phone: [phone]  FAX: [phone]  TAYSON SCHNELLE - 50 y.o. male  MRN [id number]  Date of Birth: 1970-12-15  Date: 08/04/2020  PCP: Hannah Beat, MD  Referral: Hannah Beat, MD  Chief Complaint  Patient presents with  . Annual Exam    This visit occurred during the SARS-CoV-2 public health emergency.  Safety protocols were in place, including screening questions prior to the visit, additional usage of staff PPE, and extensive cleaning of exam room while observing appropriate contact time as indicated for disinfecting solutions.   Patient Care Team: Hannah Beat, MD as PCP - General (Family Medicine) Subjective:   Eric Avila is a 50 y.o. pleasant patient who presents with the following:  Preventative Health Maintenance Visit:  Health Maintenance Summary Reviewed and updated, unless pt declines services.  Tobacco History Reviewed. Alcohol: No concerns, no excessive use Exercise Habits: Some activity, rec at least 30 mins 5 times a week STD concerns: no risk or activity to increase risk Drug Use: None  Covid?  At this point, he is not interested.  Tired a lot.  Sleeping eight hours.   60-70 hours of work weekly.  BP: no meds this AM. It is elevated today, but he forgot to take his medication.   Health Maintenance  Topic Date Due  . Hepatitis C Screening  Never done  . COVID-19 Vaccine (1) Never done  . INFLUENZA VACCINE  09/02/2020 (Originally 01/04/2020)  . TETANUS/TDAP  12/05/2026  . HIV Screening  Completed  . HPV VACCINES  Aged Out   Immunization History  Administered Date(s) Administered  . Td 10/03/2005  . Tdap 12/04/2016   Patient Active Problem List   Diagnosis Date Noted  . Obesity (BMI 30-39.9) 08/31/2016  . Hypertension 08/29/2016  . Nephrolithiasis 12/26/2007     Past Medical History:  Diagnosis Date  . Hypertension 08/29/2016  . Nephrolithiasis 12/26/2007   Qualifier: Diagnosis of  By: Jillyn Hidden FNP, Mcarthur Rossetti     History reviewed. No pertinent surgical history.  History reviewed. No pertinent family history.  Past Medical History, Surgical History, Social History, Family History, Problem List, Medications, and Allergies have been reviewed and updated if relevant.  Review of Systems: Pertinent positives are listed above.  Otherwise, a full 14 point review of systems has been done in full and it is negative except where it is noted positive.  Objective:   BP (!) 150/90   Pulse 91   Temp (!) 97.3 F (36.3 C) (Temporal)   Ht 5' 10.5" (1.791 m)   Wt 251 lb 8 oz (114.1 kg)   SpO2 96%   BMI 35.58 kg/m  Ideal Body Weight: Weight in (lb) to have BMI = 25: 176.4  Ideal Body Weight: Weight in (lb) to have BMI = 25: 176.4 No exam data present Depression screen [facility] 2/9 08/04/2020 12/04/2016  Decreased Interest 0 0  Down, Depressed, Hopeless 0 0  PHQ - 2 Score 0 0     GEN: well developed, well nourished, no acute distress Eyes: conjunctiva and lids normal, PERRLA, EOMI ENT: TM clear, nares clear, oral exam WNL Neck: supple, no lymphadenopathy, no thyromegaly, no JVD Pulm: clear to auscultation and percussion, respiratory effort normal CV: regular rate and rhythm, S1-S2, no murmur, rub or gallop, no bruits, peripheral pulses normal and symmetric, no cyanosis, clubbing, edema  or varicosities GI: soft, non-tender; no hepatosplenomegaly, masses; active bowel sounds all quadrants GU: deferred Lymph: no cervical, axillary or inguinal adenopathy MSK: gait normal, muscle tone and strength WNL, no joint swelling, effusions, discoloration, crepitus  SKIN: clear, good turgor, color WNL, no rashes, lesions, or ulcerations Neuro: normal mental status, normal strength, sensation, and motion Psych: alert; oriented to person, place and time,  normally interactive and not anxious or depressed in appearance.  All labs reviewed with patient. Results for orders placed or performed in visit on 07/28/20  Lipid panel  Result Value Ref Range   Cholesterol 210 (H) 0 - 200 mg/dL   Triglycerides 403.4 0.0 - 149.0 mg/dL   HDL 74.25 >95.63 mg/dL   VLDL 87.5 0.0 - 64.3 mg/dL   LDL Cholesterol 329 (H) 0 - 99 mg/dL   Total CHOL/HDL Ratio 5    NonHDL 165.09   CBC with Differential/Platelet  Result Value Ref Range   WBC 5.7 4.0 - 10.5 K/uL   RBC 4.87 4.22 - 5.81 Mil/uL   Hemoglobin 14.8 13.0 - 17.0 g/dL   HCT 51.8 84.1 - 66.0 %   MCV 89.6 78.0 - 100.0 fl   MCHC 33.8 30.0 - 36.0 g/dL   RDW 63.0 16.0 - 10.9 %   Platelets 220.0 150.0 - 400.0 K/uL   Neutrophils Relative % 55.0 43.0 - 77.0 %   Lymphocytes Relative 32.1 12.0 - 46.0 %   Monocytes Relative 10.1 3.0 - 12.0 %   Eosinophils Relative 2.1 0.0 - 5.0 %   Basophils Relative 0.7 0.0 - 3.0 %   Neutro Abs 3.1 1.4 - 7.7 K/uL   Lymphs Abs 1.8 0.7 - 4.0 K/uL   Monocytes Absolute 0.6 0.1 - 1.0 K/uL   Eosinophils Absolute 0.1 0.0 - 0.7 K/uL   Basophils Absolute 0.0 0.0 - 0.1 K/uL  Hepatic function panel  Result Value Ref Range   Total Bilirubin 0.7 0.2 - 1.2 mg/dL   Bilirubin, Direct 0.1 0.0 - 0.3 mg/dL   Alkaline Phosphatase 73 39 - 117 U/L   AST 14 0 - 37 U/L   ALT 17 0 - 53 U/L   Total Protein 7.2 6.0 - 8.3 g/dL   Albumin 4.2 3.5 - 5.2 g/dL  Basic metabolic panel  Result Value Ref Range   Sodium 137 135 - 145 mEq/L   Potassium 4.7 3.5 - 5.1 mEq/L   Chloride 103 96 - 112 mEq/L   CO2 31 19 - 32 mEq/L   Glucose, Bld 109 (H) 70 - 99 mg/dL   BUN 11 6 - 23 mg/dL   Creatinine, Ser 3.23 0.40 - 1.50 mg/dL   GFR 55.73 >22.02 mL/min   Calcium 9.3 8.4 - 10.5 mg/dL  Hemoglobin R4Y  Result Value Ref Range   Hgb A1c MFr Bld 6.2 4.6 - 6.5 %  PSA, Total with Reflex to PSA, Free  Result Value Ref Range   PSA, Total 0.2 < OR = 4.0 ng/mL    Assessment and Plan:     ICD-10-CM   1.  Healthcare maintenance  Z00.00   2. Colon cancer screening  Z12.11 Ambulatory referral to Gastroenterology   Globally, he is doing okay.  I encouraged him to be fit and active and work on his diet and exercise.  At this point, he would declined getting a Covid vaccine. Given his age, consult GI for colonoscopy.  Health Maintenance Exam: The patient's preventative maintenance and recommended screening tests for an annual wellness exam were reviewed  in full today. Brought up to date unless services declined.  Counselled on the importance of diet, exercise, and its role in overall health and mortality. The patient's FH and SH was reviewed, including their home life, tobacco status, and drug and alcohol status.  Follow-up in 1 year for physical exam or additional follow-up below.  Follow-up: No follow-ups on file. Or follow-up in 1 year if not noted.  No orders of the defined types were placed in this encounter.  Medications Discontinued During This Encounter  Medication Reason  . ibuprofen (ADVIL,MOTRIN) 200 MG tablet Duplicate  . amLODipine (NORVASC) 10 MG tablet Duplicate  . cyclobenzaprine (FLEXERIL) 10 MG tablet Completed Course  . HYDROcodone-acetaminophen (NORCO/VICODIN) 5-325 MG tablet Completed Course   Orders Placed This Encounter  Procedures  . Ambulatory referral to Gastroenterology    Signed,  Karleen Hampshire T. Sheresa Cullop, MD   Allergies as of 08/04/2020   No Known Allergies     Medication List       Accurate as of August 04, 2020  4:40 PM. If you have any questions, ask your nurse or doctor.        STOP taking these medications   cyclobenzaprine 10 MG tablet Commonly known as: FLEXERIL Stopped by: Hannah Beat, MD   HYDROcodone-acetaminophen 5-325 MG tablet Commonly known as: NORCO/VICODIN Stopped by: Hannah Beat, MD     TAKE these medications   amLODipine 10 MG tablet Commonly known as: NORVASC Take 1 tablet (10 mg total) by mouth daily.   ibuprofen  200 MG tablet Commonly known as: ADVIL Take 200 mg by mouth every 6 (six) hours as needed for headache, mild pain or moderate pain.   TURMERIC PO Take 1 tablet by mouth daily.

## 2021-05-26 ENCOUNTER — Other Ambulatory Visit: Payer: Self-pay | Admitting: Family Medicine

## 2021-11-24 ENCOUNTER — Telehealth (INDEPENDENT_AMBULATORY_CARE_PROVIDER_SITE_OTHER): Payer: PRIVATE HEALTH INSURANCE | Admitting: Family Medicine

## 2021-11-24 ENCOUNTER — Encounter: Payer: Self-pay | Admitting: Family Medicine

## 2021-11-24 VITALS — Wt 255.0 lb

## 2021-11-24 DIAGNOSIS — R197 Diarrhea, unspecified: Secondary | ICD-10-CM

## 2021-11-24 DIAGNOSIS — I10 Essential (primary) hypertension: Secondary | ICD-10-CM

## 2021-11-24 DIAGNOSIS — R1110 Vomiting, unspecified: Secondary | ICD-10-CM

## 2021-11-24 MED ORDER — LOSARTAN POTASSIUM 50 MG PO TABS: 50.0000 mg | ORAL_TABLET | Freq: Every day | ORAL | 0 refills | Status: DC

## 2021-11-24 MED ORDER — ONDANSETRON HCL 4 MG PO TABS: 4.0000 mg | ORAL_TABLET | Freq: Three times a day (TID) | ORAL | 0 refills | Status: DC | PRN

## 2021-11-24 NOTE — Patient Instructions (Addendum)
---------------------------------------------------------------------------------------------------------------------------    WORK SLIP:  Patient Eric Avila,  September 10, 1970, was seen for a medical visit today, 11/24/21 . Please excuse from work for illness until diarrhea and vomiting have resolved for 24 hours.   Sincerely: E-signature: Dr. Kriste Basque, DO [facility] Ph: [phone]   ------------------------------------------------------------------------------------------------------------------------------     -I sent the medication(s) we discussed to your pharmacy: Meds ordered this encounter  Medications   losartan (COZAAR) 50 MG tablet    Sig: Take 1 tablet (50 mg total) by mouth daily. For blood pressure. Start once over the stomach bug.    Dispense:  30 tablet    Refill:  0   ondansetron (ZOFRAN) 4 MG tablet    Sig: Take 1 tablet (4 mg total) by mouth every 8 (eight) hours as needed for nausea or vomiting.    Dispense:  10 tablet    Refill:  0   For the stomach issues: -plenty of water (with electrolytes if not eating) otherwise water is best -zofran per instructions if needed for nausea/vomiting -imodium for diarrhea if needed -inperson medical care if worsening or not improving with above over next 1-2 days  For the Elevated blood pressure and headaches: -eat a health whole foods plant heavy diet (such as the mediterranean diet) - see more details below and feel free to schedule follow up virtual visit with me to discuss in depth with you and your wife.  -get regular exercise (once feeling better) - 20-30 minutes daily is the goal -address stress - counseling, mindfulness exercises, etc - see below -continue the Norvasc and take every day -START the Losartan in 1 week once over the stomach bug and take every day -Schedule in office follow up in the next 2-3 weeks  I hope you are feeling better soon!  Seek in person care  promptly if your symptoms worsen, new concerns arise or you are not improving with treatment.  It was nice to meet you today. I help [facility] out with telemedicine visits on Tuesdays and Thursdays and am happy to help if you need a virtual follow up visit on those days. Otherwise, if you have any concerns or questions following this visit please schedule a follow up visit with your Primary Care office or seek care at a local urgent care clinic to avoid delays in care. If you are having severe or life threatening symptoms please call 911 and/or go to the nearest emergency room.     We recommend the following healthy lifestyle for LIFE: 1) Small portions. But, make sure to get regular (at least 3 per day), healthy meals and small healthy snacks if needed.  2) Eat a healthy clean diet.   TRY TO EAT: -at least 5-7 servings of low sugar, colorful, and nutrient rich vegetables per day (not corn, potatoes or bananas.) -berries are the best choice if you wish to eat fruit (only eat small amounts if trying to reduce weight)  -lean meets (low mercury fish sustainably caught, white meat of chicken or [place] - organic and pastured is best) -vegan proteins for some meals - beans or tofu, whole grains, nuts and seeds -Replace bad fats with good fats - good fats include: fish, nuts and seeds, olive oil, avocado oil -only small amounts of low fat or non fat dairy (no dairy is best) -small amounts of100 % whole grains - check the labels, make she the word "whole" is included in the FIRST ingredient or it  is not truly whole grain -drink plenty of water  AVOID: -SUGAR, sweets, anything with added sugar, corn syrup or sweeteners - must read labels as even foods advertised as "healthy" often are loaded with sugar -if you must have a sweetener, small amounts of stevia may be best -sweetened beverages and artificially sweetened beverages -simple starches (rice, bread, potatoes, pasta, chips, etc - small amounts  of 100% whole grains are ok) -red meat - limit to a few times per month -fried foods, fast food, processed food, excessive dairy, eggs and coconut.  3)Get at least 150 minutes of sweaty aerobic exercise per week.  4)Reduce stress - consider counseling, meditation and relaxation to balance other aspects of your life.

## 2021-12-16 ENCOUNTER — Other Ambulatory Visit: Payer: Self-pay | Admitting: Family Medicine
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
## 2021-12-23 IMAGING — CT CT L SPINE W/O CM
4 of 6 series · 16 of 33 positions shown, 18 images · IV contrast (APPLIED)
Comparison: None.

CLINICAL DATA: Back pain.  Abdominal pain.

EXAM:
CT LUMBAR SPINE WITHOUT CONTRAST
TECHNIQUE: Multidetector CT imaging of the lumbar spine was performed without
intravenous contrast administration. Multiplanar CT image
reconstructions were also generated.

[Series 3: lumbar st · axial · 0.35mm/px · z∈[+723,+897]mm · 5 of 131 slices shown, 7 images]
[im 22/131  soft-tissue]
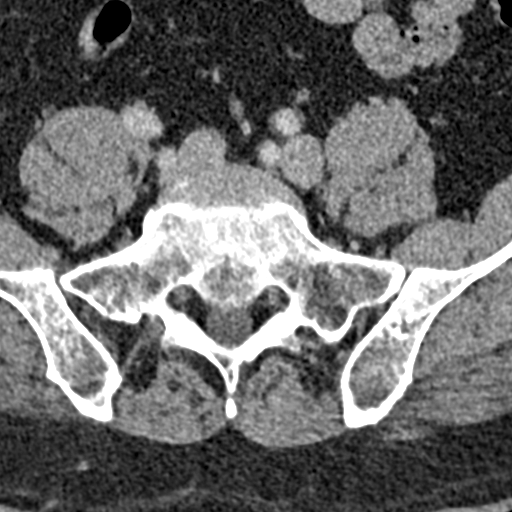
[im 22/131  bone]
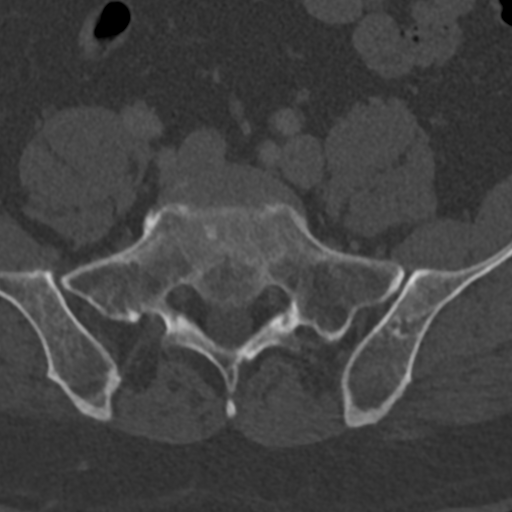
[im 44/131  bone]
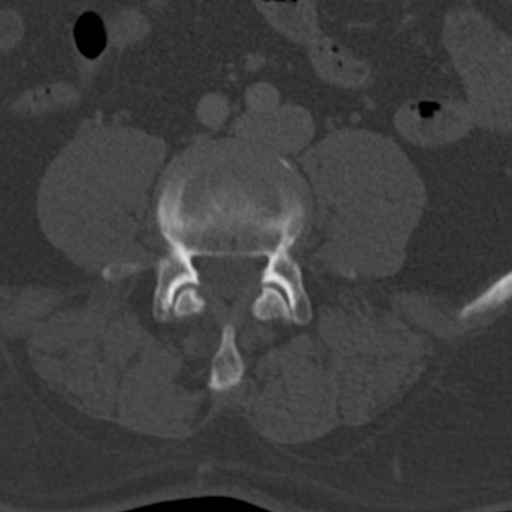
[im 66/131  bone]
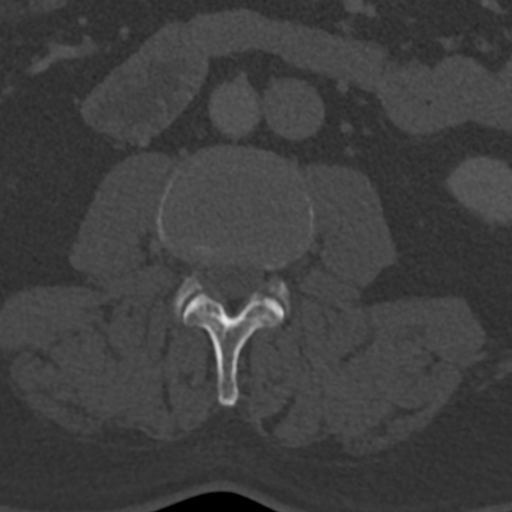
[im 87/131  bone]
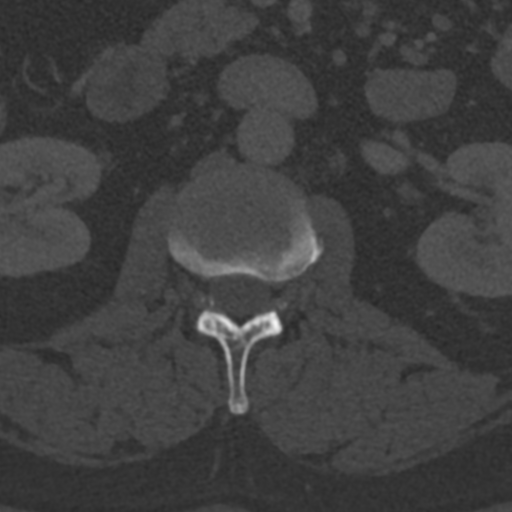
[im 109/131  soft-tissue]
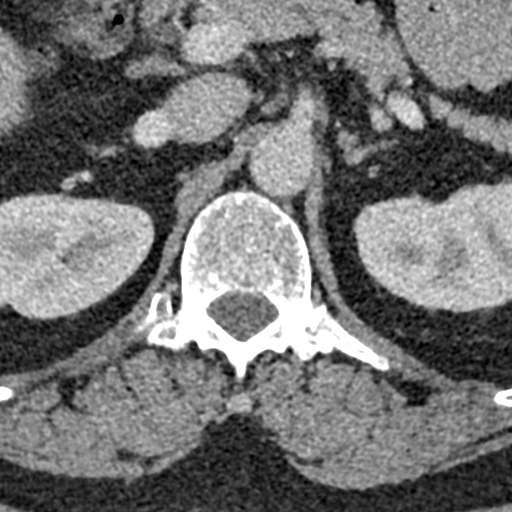
[im 109/131  bone]
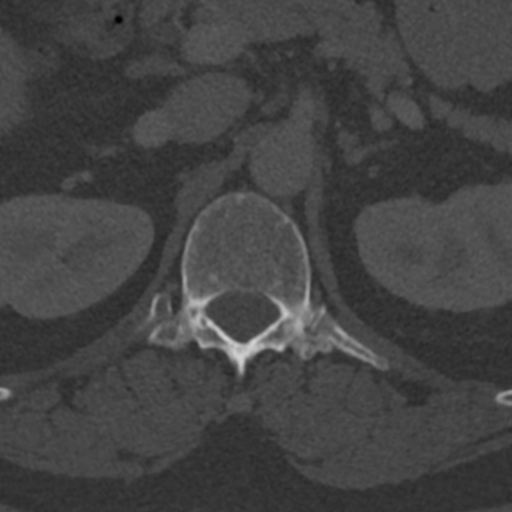

[Series 5: lumbar bone · axial · 0.35mm/px · z∈[+723,+897]mm · 5 of 131 slices shown]
[im 22/131  bone]
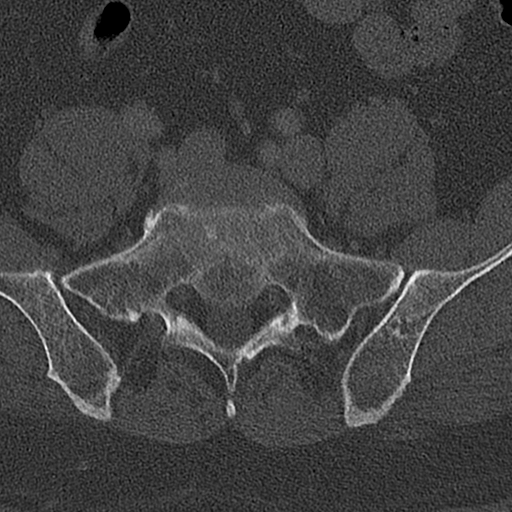
[im 44/131  bone]
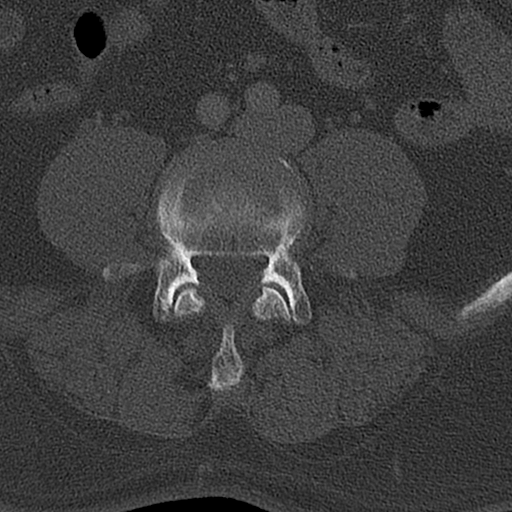
[im 66/131  bone]
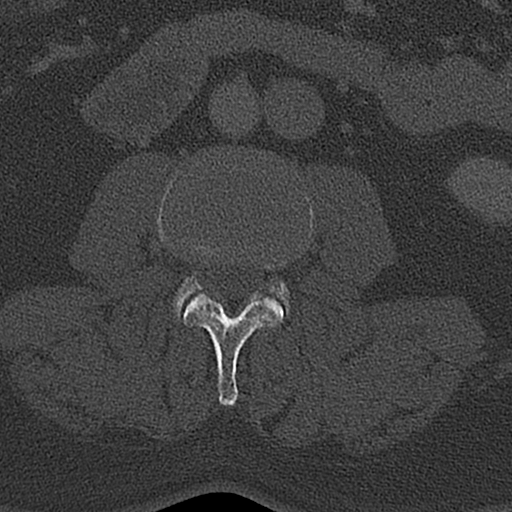
[im 87/131  bone]
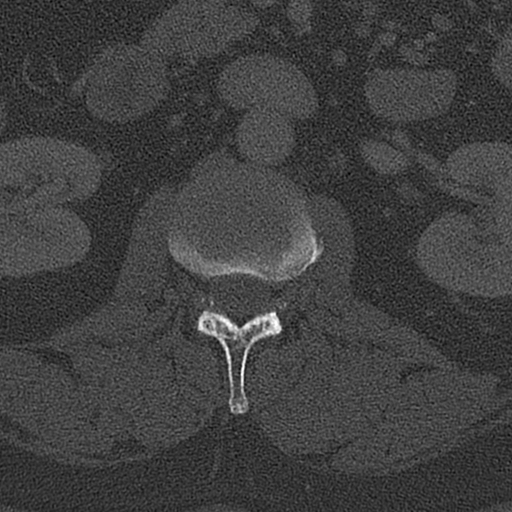
[im 109/131  bone]
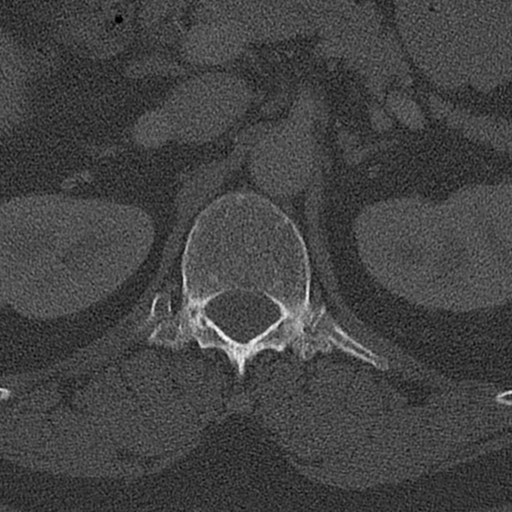

[Series 602: coronal · coronal · 0.51mm/px · 1 of 55 slices shown]
[im 28/55  bone]
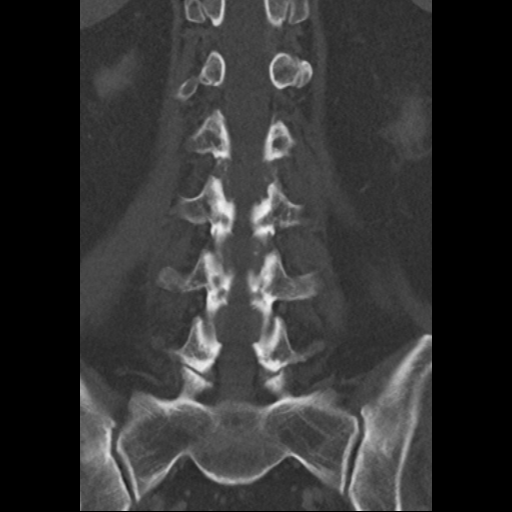

[Series 603: sagittal · sagittal · 0.51mm/px · 5 of 72 slices shown]
[im 12/72  bone]
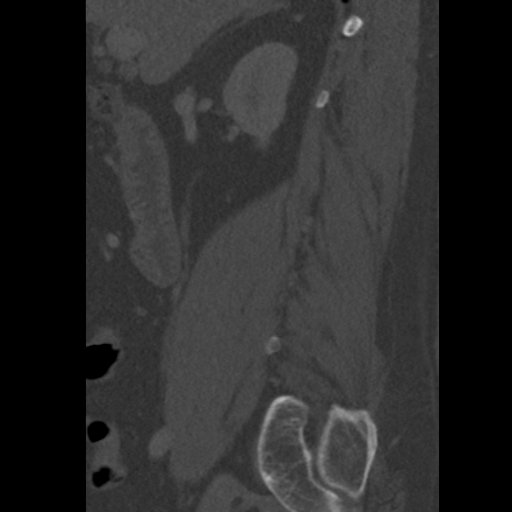
[im 24/72  bone]
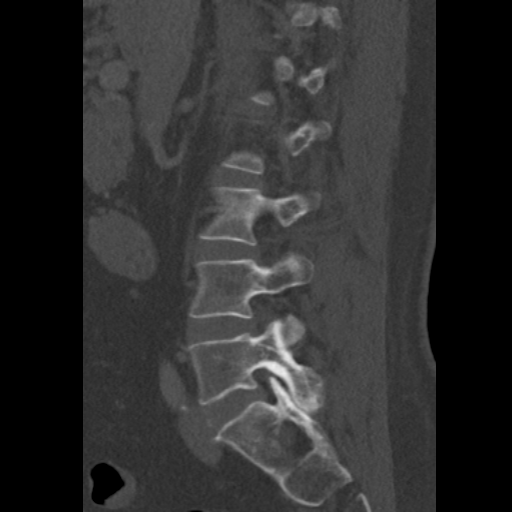
[im 36/72  bone]
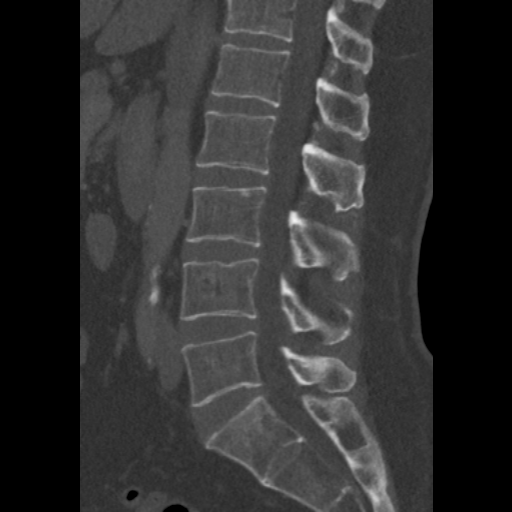
[im 48/72  bone]
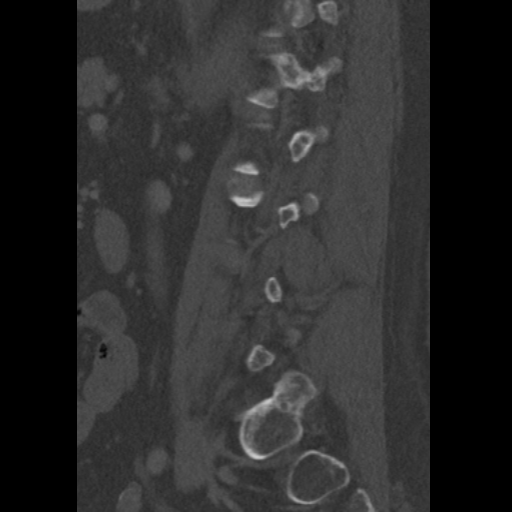
[im 60/72  bone]
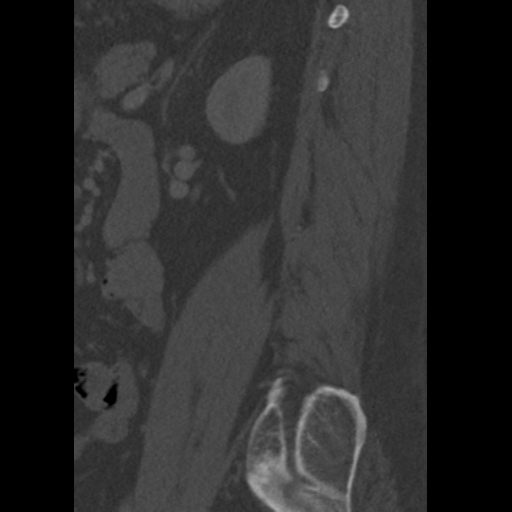

[16 of 33 positions shown; findings below may reference images not displayed]

FINDINGS: Segmentation: 5 lumbar type vertebrae.

Alignment: Normal.

Vertebrae: No acute fracture or focal pathologic process. L4
vertebral body hemangioma.

Paraspinal and other soft tissues: No acute paraspinal abnormality.
Abdominal aortic atherosclerosis.

Other: Mild osteoarthritis of bilateral SI joints.

Disc levels: Disc spaces are relatively well maintained. Mild
broad-based disc bulge at L3-4. Mild broad-based disc bulge at L4-5.
Mild broad-based disc bulge at L5-S1 with moderate bilateral facet
arthropathy.
IMPRESSION: 1. No acute osseous injury of the lumbar spine.
2. Aortic Atherosclerosis ([id number]).

## 2021-12-28 ENCOUNTER — Encounter: Payer: Self-pay | Admitting: Family Medicine

## 2021-12-28 ENCOUNTER — Ambulatory Visit (INDEPENDENT_AMBULATORY_CARE_PROVIDER_SITE_OTHER): Payer: PRIVATE HEALTH INSURANCE | Admitting: Family Medicine

## 2021-12-28 VITALS — BP 150/90 | HR 83 | Temp 98.3°F | Ht 70.5 in | Wt 258.1 lb

## 2021-12-28 DIAGNOSIS — R7303 Prediabetes: Secondary | ICD-10-CM

## 2021-12-28 DIAGNOSIS — I10 Essential (primary) hypertension: Secondary | ICD-10-CM | POA: Diagnosis not present

## 2021-12-28 DIAGNOSIS — Z79899 Other long term (current) drug therapy: Secondary | ICD-10-CM | POA: Diagnosis not present

## 2021-12-28 DIAGNOSIS — Z13220 Encounter for screening for lipoid disorders: Secondary | ICD-10-CM

## 2021-12-28 DIAGNOSIS — Z125 Encounter for screening for malignant neoplasm of prostate: Secondary | ICD-10-CM

## 2021-12-28 LAB — BASIC METABOLIC PANEL
BUN: 11 mg/dL (ref 6–23)
CO2: 32 mEq/L (ref 19–32)
Calcium: 9.2 mg/dL (ref 8.4–10.5)
Chloride: 102 mEq/L (ref 96–112)
Creatinine, Ser: 0.96 mg/dL (ref 0.40–1.50)
GFR: 91.62 mL/min (ref >60.00)
Glucose, Bld: 106 mg/dL — ABNORMAL HIGH (ref 70–99)
Potassium: 4.1 mEq/L (ref 3.5–5.1)
Sodium: 138 mEq/L (ref 135–145)

## 2021-12-28 LAB — HEPATIC FUNCTION PANEL
ALT: 14 U/L (ref 0–53)
AST: 14 U/L (ref 0–37)
Albumin: 4.1 g/dL (ref 3.5–5.2)
Alkaline Phosphatase: 67 U/L (ref 39–117)
Bilirubin, Direct: 0.1 mg/dL (ref 0.0–0.3)
Total Bilirubin: 0.6 mg/dL (ref 0.2–1.2)
Total Protein: 7.4 g/dL (ref 6.0–8.3)

## 2021-12-28 LAB — LIPID PANEL
Cholesterol: 193 mg/dL (ref 0–200)
HDL: 39.4 mg/dL (ref >39.00)
LDL Cholesterol: 130 mg/dL — ABNORMAL HIGH (ref 0–99)
NonHDL: 153.13
Total CHOL/HDL Ratio: 5
Triglycerides: 117 mg/dL (ref 0.0–149.0)
VLDL: 23.4 mg/dL (ref 0.0–40.0)

## 2021-12-28 LAB — HEMOGLOBIN A1C: Hgb A1c MFr Bld: 6.3 % (ref 4.6–6.5)

## 2021-12-28 MED ORDER — AMLODIPINE BESYLATE 10 MG PO TABS: 10.0000 mg | ORAL_TABLET | Freq: Every day | ORAL | 3 refills | Status: DC

## 2021-12-28 NOTE — Progress Notes (Signed)
Eric Geraghty T. Luzelena Heeg, MD, CAQ Sports Medicine [facility] at [facility] [address] [place], [address]  Phone: [phone]  FAX: [phone]  Eric Avila - 51 y.o. male  MRN [id number]  Date of Birth: 05-Mar-1971  Date: 12/28/2021  PCP: Hannah Beat, MD  Referral: Hannah Beat, MD  Chief Complaint  Patient presents with   Follow-up    Headache and BP-Seen Eric Avila (virtual) on 11/24/21   Subjective:   Eric Avila is a 51 y.o. very pleasant male patient with Body mass index is 36.51 kg/m. who presents with the following:  He is doing well.  His headache has returned some after he ran out of his Norvasc.  Prior to this after starting losartan, his headaches and blood pressure got much better and back to normal.  Also follow-up on prediabetes.  The last hemoglobin A1c we had was 6.2.  He has also gained some weight.  HTN: Tolerating all medications without side effects Stable and at goal No CP, no sob. No HA.  BP Readings from Last 3 Encounters:  12/28/21 (!) 150/90  08/04/20 (!) 150/90  11/03/19 (!) 145/93    Basic Metabolic Panel:    Component Value Date/Time   NA 137 07/28/2020 0942   K 4.7 07/28/2020 0942   CL 103 07/28/2020 0942   CO2 31 07/28/2020 0942   BUN 11 07/28/2020 0942   CREATININE 0.92 07/28/2020 0942   CREATININE 0.98 01/16/2014 1629   GLUCOSE 109 (H) 07/28/2020 0942   CALCIUM 9.3 07/28/2020 0942      Review of Systems is noted in the HPI, as appropriate  Objective:   BP (!) 150/90   Pulse 83   Temp 98.3 F (36.8 C) (Oral)   Ht 5' 10.5" (1.791 m)   Wt 258 lb 2 oz (117.1 kg)   SpO2 96%   BMI 36.51 kg/m   GEN: No acute distress; alert,appropriate. PULM: Breathing comfortably in no respiratory distress PSYCH: Normally interactive.  CV: RRR, no m/g/r   Laboratory and Imaging Data:  Assessment and Plan:     ICD-10-CM   1. Essential hypertension  I10     2. Screening, lipid  Z13.220  Lipid panel    3. Prediabetes  R73.03 Basic metabolic panel    Hemoglobin A1c    4. Screening PSA (prostate specific antigen)  Z12.5 PSA, Total with Reflex to PSA, Free    5. Encounter for long-term (current) use of medications  Z79.899 Hepatic function panel     Improved blood pressure. Refill amlodipine.  Hopefully, his blood sugar has not increased intervally.  Medication Management during today's office visit: Meds ordered this encounter  Medications   amLODipine (NORVASC) 10 MG tablet    Sig: Take 1 tablet (10 mg total) by mouth daily.    Dispense:  90 tablet    Refill:  3   Medications Discontinued During This Encounter  Medication Reason   amLODipine (NORVASC) 10 MG tablet Reorder    Orders placed today for conditions managed today: Orders Placed This Encounter  Procedures   Basic metabolic panel   Hepatic function panel   Hemoglobin A1c   Lipid panel   PSA, Total with Reflex to PSA, Free    Follow-up if needed: No follow-ups on file.  Dr[employer] One speech-to-text software was used for transcription in this dictation.  Possible transcriptional errors can occur using Animal nutritionist.   Signed,  Elpidio Galea. Valon Glasscock, MD   Outpatient  Encounter Medications as of 12/28/2021  Medication Sig   ibuprofen (ADVIL) 200 MG tablet Take 200 mg by mouth every 6 (six) hours as needed for headache, mild pain or moderate pain.   losartan (COZAAR) 50 MG tablet Take 1 tablet (50 mg total) by mouth daily.   ondansetron (ZOFRAN) 4 MG tablet Take 1 tablet (4 mg total) by mouth every 8 (eight) hours as needed for nausea or vomiting.   TURMERIC PO Take 1 tablet by mouth daily.   [DISCONTINUED] amLODipine (NORVASC) 10 MG tablet TAKE 1 TABLET BY MOUTH EVERY DAY   amLODipine (NORVASC) 10 MG tablet Take 1 tablet (10 mg total) by mouth daily.   No facility-administered encounter medications on file as of 12/28/2021.

## 2021-12-30 LAB — PSA, TOTAL WITH REFLEX TO PSA, FREE: PSA, Total: 0.3 ng/mL (ref <=4.0)

## 2022-04-03 ENCOUNTER — Telehealth: Payer: Self-pay | Admitting: Family Medicine

## 2022-04-03 MED ORDER — LOSARTAN POTASSIUM 50 MG PO TABS: 50.0000 mg | ORAL_TABLET | Freq: Every day | ORAL | 1 refills | Status: DC

## 2022-04-03 NOTE — Telephone Encounter (Signed)
  Encourage patient to contact the pharmacy for refills or they can request refills through [facility]  Did the patient contact the pharmacy:  yes   LAST APPOINTMENT DATE:  Please schedule appointment if longer than 1 year 12/28/2021  NEXT APPOINTMENT DATE:  MEDICATION:losartan (COZAAR) 50 MG tablet  Is the patient out of medication? yes  If not, how much is left?  Is this a 90 day supply: yes  PHARMACY: [id number] - [place], [place] Phone:  [phone]  Fax:  [phone]      Let patient know to contact pharmacy at the end of the day to make sure medication is ready.  Please notify patient to allow 48-72 hours to process

## 2022-04-03 NOTE — Telephone Encounter (Signed)
Refill sent as requested. 

## 2022-10-03 ENCOUNTER — Other Ambulatory Visit: Payer: Self-pay | Admitting: Family Medicine

## 2022-12-17 ENCOUNTER — Other Ambulatory Visit: Payer: Self-pay | Admitting: Family Medicine

## 2022-12-17 NOTE — Telephone Encounter (Signed)
Please schedule CPE with fasting labs prior with Dr. Copland.  

## 2022-12-18 NOTE — Telephone Encounter (Signed)
 LVM for patient to call back and schedule

## 2023-01-01 ENCOUNTER — Other Ambulatory Visit: Payer: Self-pay | Admitting: Family Medicine

## 2023-01-01 NOTE — Telephone Encounter (Signed)
LVMTCB

## 2023-01-01 NOTE — Telephone Encounter (Signed)
Please schedule CPE with fasting labs prior with Dr. Copland.  

## 2023-03-20 ENCOUNTER — Other Ambulatory Visit: Payer: Self-pay | Admitting: Family Medicine

## 2023-03-20 NOTE — Telephone Encounter (Signed)
Lvmtcb, sent mychart message

## 2023-03-20 NOTE — Telephone Encounter (Signed)
Please call and schedule CPE with fasting labs prior with Dr. Patsy Lager.

## 2023-04-02 ENCOUNTER — Other Ambulatory Visit: Payer: Self-pay | Admitting: Family Medicine

## 2023-04-02 NOTE — Telephone Encounter (Signed)
Last office visit 12/28/2021 for HTN.  Patient has been contacted on numerous occasions to call and schedule CPE.  No future appointments.  Last refilled 01/01/23 for #90 with no refills.  Refill?

## 2023-04-15 ENCOUNTER — Other Ambulatory Visit: Payer: Self-pay | Admitting: Family Medicine

## 2023-04-30 ENCOUNTER — Other Ambulatory Visit: Payer: Self-pay | Admitting: Family Medicine

## 2023-05-04 ENCOUNTER — Other Ambulatory Visit: Payer: Self-pay | Admitting: Family Medicine

## 2023-06-06 DIAGNOSIS — R7303 Prediabetes: Secondary | ICD-10-CM | POA: Insufficient documentation

## 2023-06-06 NOTE — Progress Notes (Signed)
 Eric Kirchhoff T. Eric Monfort, MD, CAQ Sports Medicine [name] [address] [place], [address]  Phone: [phone]  FAX: [phone]  [name] - 53 y.o. male  MRN [id number]  Date of Birth: 04/04/1971  Date: 06/07/2023  PCP: Watt Mirza, MD  Referral: Watt Mirza, MD  Chief Complaint  Patient presents with   Annual Exam   Patient Care Team: Watt Mirza, MD as PCP - General (Family Medicine) Subjective:   Eric Avila is a 53 y.o. pleasant patient who presents with the following:  Preventative Health Maintenance Visit:  Health Maintenance Summary Reviewed and updated, unless pt declines services.  Tobacco History Reviewed.  None. Alcohol: No concerns, no excessive use.  Rare. Exercise Habits: Some activity, rec at least 30 mins 5 times a week -not really doing much right now outside of his normal workday. STD concerns: no risk or activity to increase risk Drug Use: None  Feet hurting some Fatigue - all the time - working 60+ hours a week 6-7 hours of sleep during the week  Labs  Covid - no Flu - today Shingrix  - today  HIV Hep C Cologuard vs colon -he is open to doing Cologuard    Health Maintenance  Topic Date Due   HIV Screening  Never done   Hepatitis C Screening  Never done   Fecal DNA (Cologuard)  Never done   COVID-19 Vaccine (1) 06/23/2023 (Originally 08/23/1975)   Zoster Vaccines- Shingrix  (2 of 2) 09/05/2023 (Originally 08/02/2023)   DTaP/Tdap/Td (3 - Td or Tdap) 12/05/2026   INFLUENZA VACCINE  Completed   HPV VACCINES  Aged Out   Immunization History  Administered Date(s) Administered   Influenza, Seasonal, Injecte, Preservative Fre 06/07/2023   Td 10/03/2005   Tdap 12/04/2016   Zoster Recombinant(Shingrix ) 06/07/2023   Patient Active Problem List   Diagnosis Date Noted   Essential hypertension 08/29/2016    Priority: Medium    Prediabetes 06/06/2023   Nephrolithiasis  12/26/2007    Past Medical History:  Diagnosis Date   Hypertension 08/29/2016   Nephrolithiasis 12/26/2007    History reviewed. No pertinent surgical history.  History reviewed. No pertinent family history.  Social History   Social History Narrative   Librarian, academic    Past Medical History, Surgical History, Social History, Family History, Problem List, Medications, and Allergies have been reviewed and updated if relevant.  Review of Systems: Pertinent positives are listed above.  Otherwise, a full 14 point review of systems has been done in full and it is negative except where it is noted positive.  Objective:   BP (!) 160/98   Pulse 89   Temp 97.8 F (36.6 C) (Temporal)   Ht 5' 10 (1.778 m)   Wt 269 lb 8 oz (122.2 kg)   SpO2 98%   BMI 38.67 kg/m  Ideal Body Weight: Weight in (lb) to have BMI = 25: 173.9  Ideal Body Weight: Weight in (lb) to have BMI = 25: 173.9 No results found.    06/07/2023    3:54 PM 11/24/2021   11:45 AM 08/04/2020    8:26 AM 12/04/2016    8:40 AM  Depression screen PHQ 2/9  Decreased Interest 0 0 0 0  Down, Depressed, Hopeless 0 0 0 0  PHQ - 2 Score 0 0 0 0  Altered sleeping  3    Tired, decreased energy  3    Change in appetite  0    Feeling  bad or failure about yourself   0    Trouble concentrating  0    Moving slowly or fidgety/restless  0    Suicidal thoughts  0    PHQ-9 Score  6    Difficult doing work/chores  Not difficult at all       GEN: well developed, well nourished, no acute distress Eyes: conjunctiva and lids normal, PERRLA, EOMI ENT: TM clear, nares clear, oral exam WNL Neck: supple, no lymphadenopathy, no thyromegaly, no JVD Pulm: clear to auscultation and percussion, respiratory effort normal CV: regular rate and rhythm, S1-S2, no murmur, rub or gallop, no bruits, peripheral pulses normal and symmetric, no cyanosis, clubbing, edema or varicosities GI: soft, non-tender; no hepatosplenomegaly, masses; active bowel  sounds all quadrants GU: deferred Lymph: no cervical, axillary or inguinal adenopathy MSK: gait normal, muscle tone and strength WNL, no joint swelling, effusions, discoloration, crepitus  SKIN: clear, good turgor, color WNL, no rashes, lesions, or ulcerations Neuro: normal mental status, normal strength, sensation, and motion Psych: alert; oriented to person, place and time, normally interactive and not anxious or depressed in appearance.  All labs reviewed with patient. Results for orders placed or performed in visit on 12/28/21  Basic metabolic panel   Collection Time: 12/28/21  9:25 AM  Result Value Ref Range   Sodium 138 135 - 145 mEq/L   Potassium 4.1 3.5 - 5.1 mEq/L   Chloride 102 96 - 112 mEq/L   CO2 32 19 - 32 mEq/L   Glucose, Bld 106 (H) 70 - 99 mg/dL   BUN 11 6 - 23 mg/dL   Creatinine, Ser 9.03 0.40 - 1.50 mg/dL   GFR 08.37 >39.99 mL/min   Calcium 9.2 8.4 - 10.5 mg/dL  Hepatic function panel   Collection Time: 12/28/21  9:25 AM  Result Value Ref Range   Total Bilirubin 0.6 0.2 - 1.2 mg/dL   Bilirubin, Direct 0.1 0.0 - 0.3 mg/dL   Alkaline Phosphatase 67 39 - 117 U/L   AST 14 0 - 37 U/L   ALT 14 0 - 53 U/L   Total Protein 7.4 6.0 - 8.3 g/dL   Albumin 4.1 3.5 - 5.2 g/dL  Hemoglobin J8r   Collection Time: 12/28/21  9:25 AM  Result Value Ref Range   Hgb A1c MFr Bld 6.3 4.6 - 6.5 %  Lipid panel   Collection Time: 12/28/21  9:25 AM  Result Value Ref Range   Cholesterol 193 0 - 200 mg/dL   Triglycerides 882.9 0.0 - 149.0 mg/dL   HDL 60.59 >60.99 mg/dL   VLDL 76.5 0.0 - 59.9 mg/dL   LDL Cholesterol 869 (H) 0 - 99 mg/dL   Total CHOL/HDL Ratio 5    NonHDL 153.13   PSA, Total with Reflex to PSA, Free   Collection Time: 12/28/21  9:25 AM  Result Value Ref Range   PSA, Total 0.3 < OR = 4.0 ng/mL    Assessment and Plan:     ICD-10-CM   1. Healthcare maintenance  Z00.00 Flu vaccine trivalent PF, 6mos and older(Flulaval,Afluria,Fluarix,Fluzone)    Zoster Recombinant  (Shingrix  )    2. Prediabetes  R73.03 Basic metabolic panel    Hemoglobin A1c    3. Colon cancer screening  Z12.11 Cologuard    4. Encounter for long-term (current) use of medications  Z79.899 CBC with Differential/Platelet    Hepatic function panel    5. Screening, lipid  Z13.220 Lipid panel    6. Screening for HIV (human immunodeficiency  virus)  Z11.4 HIV Antibody (routine testing w rflx)    7. Need for hepatitis C screening test  Z11.59 Hepatitis C antibody    8. Screening for prostate cancer  Z12.5 PSA, Total with Reflex to PSA, Free    9. Need for influenza vaccination  Z23 Flu vaccine trivalent PF, 6mos and older(Flulaval,Afluria,Fluarix,Fluzone)    10. Need for shingles vaccine  Z23 Zoster Recombinant (Shingrix  )     He will do Cologuard, so I made that referral.  Flu vaccination and Shingrix  vaccination today.  We will do basic labs as above, follow-up prediabetes, screening for HIV and hep C, screening PSA and additional basic labs.  Blood pressure is elevated.  I am going to increase his losartan  dose to 100 mg, continue amlodipine , and have him follow-up in 2 months regarding blood pressure.  Health Maintenance Exam: The patient's preventative maintenance and recommended screening tests for an annual wellness exam were reviewed in full today. Brought up to date unless services declined.  Counselled on the importance of diet, exercise, and its role in overall health and mortality. The patient's FH and SH was reviewed, including their home life, tobacco status, and drug and alcohol status.  Follow-up in 1 year for physical exam or additional follow-up below.  Disposition: 2 months, hypertension  Meds ordered this encounter  Medications   amLODipine  (NORVASC ) 10 MG tablet    Sig: Take 1 tablet (10 mg total) by mouth daily.    Dispense:  90 tablet    Refill:  3   losartan  (COZAAR ) 100 MG tablet    Sig: Take 1 tablet (100 mg total) by mouth daily.    Dispense:   90 tablet    Refill:  3   Medications Discontinued During This Encounter  Medication Reason   ondansetron  (ZOFRAN ) 4 MG tablet Completed Course   TURMERIC PO Completed Course   amLODipine  (NORVASC ) 10 MG tablet Reorder   losartan  (COZAAR ) 50 MG tablet Reorder   Orders Placed This Encounter  Procedures   Flu vaccine trivalent PF, 6mos and older(Flulaval,Afluria,Fluarix,Fluzone)   Zoster Recombinant (Shingrix  )   Cologuard   Basic metabolic panel   CBC with Differential/Platelet   Hepatic function panel   Hemoglobin A1c   Lipid panel   PSA, Total with Reflex to PSA, Free   HIV Antibody (routine testing w rflx)   Hepatitis C antibody    Signed,  Marquell Saenz T. Lynanne Delgreco, MD   Allergies as of 06/07/2023   No Known Allergies      Medication List        Accurate as of June 07, 2023 11:59 PM. If you have any questions, ask your nurse or doctor.          STOP taking these medications    ondansetron  4 MG tablet Commonly known as: Zofran  Stopped by: Jacques Deven Audi   TURMERIC PO Stopped by: Jacques Mia Winthrop       TAKE these medications    amLODipine  10 MG tablet Commonly known as: NORVASC  Take 1 tablet (10 mg total) by mouth daily.   ibuprofen 200 MG tablet Commonly known as: ADVIL Take 200 mg by mouth every 6 (six) hours as needed for headache, mild pain or moderate pain.   losartan  100 MG tablet Commonly known as: COZAAR  Take 1 tablet (100 mg total) by mouth daily. What changed:  medication strength how much to take Changed by: Jacques Ana Liaw

## 2023-06-07 ENCOUNTER — Ambulatory Visit (INDEPENDENT_AMBULATORY_CARE_PROVIDER_SITE_OTHER): Payer: PRIVATE HEALTH INSURANCE | Admitting: Family Medicine

## 2023-06-07 ENCOUNTER — Encounter: Payer: Self-pay | Admitting: Family Medicine

## 2023-06-07 VITALS — BP 160/98 | HR 89 | Temp 97.8°F | Ht 70.0 in | Wt 269.5 lb

## 2023-06-07 DIAGNOSIS — Z114 Encounter for screening for human immunodeficiency virus [HIV]: Secondary | ICD-10-CM

## 2023-06-07 DIAGNOSIS — Z79899 Other long term (current) drug therapy: Secondary | ICD-10-CM

## 2023-06-07 DIAGNOSIS — Z1211 Encounter for screening for malignant neoplasm of colon: Secondary | ICD-10-CM

## 2023-06-07 DIAGNOSIS — Z1159 Encounter for screening for other viral diseases: Secondary | ICD-10-CM

## 2023-06-07 DIAGNOSIS — Z13220 Encounter for screening for lipoid disorders: Secondary | ICD-10-CM

## 2023-06-07 DIAGNOSIS — R7303 Prediabetes: Secondary | ICD-10-CM | POA: Diagnosis not present

## 2023-06-07 DIAGNOSIS — Z23 Encounter for immunization: Secondary | ICD-10-CM | POA: Diagnosis not present

## 2023-06-07 DIAGNOSIS — Z0000 Encounter for general adult medical examination without abnormal findings: Secondary | ICD-10-CM | POA: Diagnosis not present

## 2023-06-07 DIAGNOSIS — Z125 Encounter for screening for malignant neoplasm of prostate: Secondary | ICD-10-CM

## 2023-06-07 MED ORDER — AMLODIPINE BESYLATE 10 MG PO TABS: 10.0000 mg | ORAL_TABLET | Freq: Every day | ORAL | 3 refills | Status: DC

## 2023-06-07 MED ORDER — LOSARTAN POTASSIUM 100 MG PO TABS: 100.0000 mg | ORAL_TABLET | Freq: Every day | ORAL | 3 refills | Status: DC

## 2023-06-08 ENCOUNTER — Encounter: Payer: Self-pay | Admitting: Family Medicine

## 2023-06-08 LAB — LIPID PANEL
Cholesterol: 245 mg/dL — ABNORMAL HIGH (ref 0–200)
HDL: 45.8 mg/dL (ref >39.00)
LDL Cholesterol: 155 mg/dL — ABNORMAL HIGH (ref 0–99)
NonHDL: 199.44
Total CHOL/HDL Ratio: 5
Triglycerides: 221 mg/dL — ABNORMAL HIGH (ref 0.0–149.0)
VLDL: 44.2 mg/dL — ABNORMAL HIGH (ref 0.0–40.0)

## 2023-06-08 LAB — CBC WITH DIFFERENTIAL/PLATELET
Basophils Absolute: 0.1 10*3/uL (ref 0.0–0.1)
Basophils Relative: 1.1 % (ref 0.0–3.0)
Eosinophils Absolute: 0.1 10*3/uL (ref 0.0–0.7)
Eosinophils Relative: 1.5 % (ref 0.0–5.0)
HCT: 41.9 % (ref 39.0–52.0)
Hemoglobin: 13.9 g/dL (ref 13.0–17.0)
Lymphocytes Relative: 24.6 % (ref 12.0–46.0)
Lymphs Abs: 2.4 10*3/uL (ref 0.7–4.0)
MCHC: 33.3 g/dL (ref 30.0–36.0)
MCV: 92.7 fL (ref 78.0–100.0)
Monocytes Absolute: 0.8 10*3/uL (ref 0.1–1.0)
Monocytes Relative: 8.6 % (ref 3.0–12.0)
Neutro Abs: 6.1 10*3/uL (ref 1.4–7.7)
Neutrophils Relative %: 64.2 % (ref 43.0–77.0)
Platelets: 290 10*3/uL (ref 150.0–400.0)
RBC: 4.52 Mil/uL (ref 4.22–5.81)
RDW: 13.9 % (ref 11.5–15.5)
WBC: 9.6 10*3/uL (ref 4.0–10.5)

## 2023-06-08 LAB — BASIC METABOLIC PANEL
BUN: 11 mg/dL (ref 6–23)
CO2: 26 meq/L (ref 19–32)
Calcium: 9.5 mg/dL (ref 8.4–10.5)
Chloride: 101 meq/L (ref 96–112)
Creatinine, Ser: 0.88 mg/dL (ref 0.40–1.50)
GFR: 98.67 mL/min (ref >60.00)
Glucose, Bld: 108 mg/dL — ABNORMAL HIGH (ref 70–99)
Potassium: 3.8 meq/L (ref 3.5–5.1)
Sodium: 138 meq/L (ref 135–145)

## 2023-06-08 LAB — HEPATIC FUNCTION PANEL
ALT: 16 U/L (ref 0–53)
AST: 14 U/L (ref 0–37)
Albumin: 4.3 g/dL (ref 3.5–5.2)
Alkaline Phosphatase: 75 U/L (ref 39–117)
Bilirubin, Direct: 0.1 mg/dL (ref 0.0–0.3)
Total Bilirubin: 0.3 mg/dL (ref 0.2–1.2)
Total Protein: 7.3 g/dL (ref 6.0–8.3)

## 2023-06-08 LAB — HEMOGLOBIN A1C: Hgb A1c MFr Bld: 6.6 % — ABNORMAL HIGH (ref 4.6–6.5)

## 2023-06-11 LAB — HEPATITIS C ANTIBODY: Hepatitis C Ab: NONREACTIVE

## 2023-06-11 LAB — HIV ANTIBODY (ROUTINE TESTING W REFLEX): HIV 1&2 Ab, 4th Generation: NONREACTIVE

## 2023-06-11 LAB — PSA, TOTAL WITH REFLEX TO PSA, FREE: PSA, Total: 0.2 ng/mL (ref <=4.0)

## 2023-06-20 ENCOUNTER — Encounter: Payer: Self-pay | Admitting: *Deleted

## 2023-06-20 NOTE — Progress Notes (Signed)
 Tough - hopefully he will get his results and reach out.  Mutliple issues going on.

## 2023-07-08 LAB — COLOGUARD: COLOGUARD: NEGATIVE

## 2023-08-09 ENCOUNTER — Encounter: Payer: Self-pay | Admitting: Family Medicine

## 2023-08-09 ENCOUNTER — Ambulatory Visit (INDEPENDENT_AMBULATORY_CARE_PROVIDER_SITE_OTHER): Payer: PRIVATE HEALTH INSURANCE | Admitting: Family Medicine

## 2023-08-09 VITALS — BP 138/86 | HR 71 | Temp 97.8°F | Ht 70.0 in | Wt 272.4 lb

## 2023-08-09 DIAGNOSIS — E119 Type 2 diabetes mellitus without complications: Secondary | ICD-10-CM

## 2023-08-09 DIAGNOSIS — I10 Essential (primary) hypertension: Secondary | ICD-10-CM | POA: Diagnosis not present

## 2023-08-09 HISTORY — DX: Type 2 diabetes mellitus without complications: E11.9

## 2023-08-09 MED ORDER — ACCU-CHEK FASTCLIX LANCET KIT: PACK | 0 refills | Status: AC

## 2023-08-09 MED ORDER — ACCU-CHEK FASTCLIX LANCETS MISC: 3 refills | Status: AC

## 2023-08-09 MED ORDER — ACCU-CHEK GUIDE TEST VI STRP: ORAL_STRIP | 3 refills | Status: AC

## 2023-08-09 MED ORDER — ACCU-CHEK GUIDE ME W/DEVICE KIT: PACK | 0 refills | Status: AC

## 2023-08-09 NOTE — Patient Instructions (Signed)
 American Diabetes Association - check out their website

## 2023-08-09 NOTE — Progress Notes (Signed)
 Daegon Deiss T. Genna Casimir, MD, CAQ Sports Medicine [facility] [address] [place], [address]  Phone: [phone]  FAX: [phone]  Eric Avila - 53 y.o. male  MRN [id number]  Date of Birth: 1970/08/10  Date: 08/09/2023  PCP: Hannah Beat, MD  Referral: Hannah Beat, MD  Chief Complaint  Patient presents with   Hypertension    Follow Up   Subjective:   Eric Avila is a 53 y.o. very pleasant male patient with Body mass index is 39.08 kg/m. who presents with the following:  Patient presents for blood pressure follow-up.  I saw him last in early January for his general physical.  At that point, increase his losartan to 100 mg, and he continues to be on amlodipine 10 mg.  Blood pressure has been very stable, and he has done a very good job with this and trying to cut back on his salt and fast food intake.  Wt Readings from Last 3 Encounters:  08/09/23 272 lb 6 oz (123.5 kg)  06/07/23 269 lb 8 oz (122.2 kg)  12/28/21 258 lb 2 oz (117.1 kg)    BP Readings from Last 3 Encounters:  08/09/23 138/86  06/07/23 (!) 160/98  12/28/21 (!) 150/90    On his last blood work, he did have an hemoglobin A1c of 6.6.  We tried to contact him unsuccessfully a number of times about his blood work results.  Diabetes Mellitus: Tolerating Medications: None Compliance with diet: fair, Body mass index is 39.08 kg/m. Exercise: minimal / intermittent Avg blood sugars at home: not checking Foot problems: none Hypoglycemia: none No nausea, vomitting, blurred vision, polyuria.  Lab Results  Component Value Date   HGBA1C 6.6 (H) 06/07/2023   HGBA1C 6.3 12/28/2021   HGBA1C 6.2 07/28/2020   Lab Results  Component Value Date   LDLCALC 155 (H) 06/07/2023   CREATININE 0.88 06/07/2023    Wt Readings from Last 3 Encounters:  08/09/23 272 lb 6 oz (123.5 kg)  06/07/23 269 lb 8 oz (122.2 kg)  12/28/21 258 lb 2 oz (117.1 kg)     Review of  Systems is noted in the HPI, as appropriate  Objective:   BP 138/86 (BP Location: Left Arm, Patient Position: Sitting, Cuff Size: Large)   Pulse 71   Temp 97.8 F (36.6 C) (Temporal)   Ht 5\' 10"  (1.778 m)   Wt 272 lb 6 oz (123.5 kg)   SpO2 96%   BMI 39.08 kg/m   GEN: No acute distress; alert,appropriate. PSYCH: Normally interactive.  CV: RRR, no m/g/r  PULM: Normal respiratory rate, no accessory muscle use. No wheezes, crackles or rhonchi   Laboratory and Imaging Data: Lab Review:     Latest Ref Rng & Units 06/07/2023    4:28 PM 07/28/2020    9:42 AM 11/03/2019    1:34 PM  CBC EXTENDED  WBC 4.0 - 10.5 K/uL 9.6  5.7  8.9   RBC 4.22 - 5.81 Mil/uL 4.52  4.87  4.48   Hemoglobin 13.0 - 17.0 g/dL 34.7  42.5  95.6   HCT 39.0 - 52.0 % 41.9  43.6  41.6   Platelets 150.0 - 400.0 K/uL 290.0  220.0  280   NEUT# 1.4 - 7.7 K/uL 6.1  3.1    Lymph# 0.7 - 4.0 K/uL 2.4  1.8         Latest Ref Rng & Units 06/07/2023    4:28 PM 12/28/2021  9:25 AM 07/28/2020    9:42 AM  BMP  Glucose 70 - 99 mg/dL 161  096  045   BUN 6 - 23 mg/dL 11  11  11    Creatinine 0.40 - 1.50 mg/dL 4.09  8.11  9.14   Sodium 135 - 145 mEq/L 138  138  137   Potassium 3.5 - 5.1 mEq/L 3.8  4.1  4.7   Chloride 96 - 112 mEq/L 101  102  103   CO2 19 - 32 mEq/L 26  32  31   Calcium 8.4 - 10.5 mg/dL 9.5  9.2  9.3        Latest Ref Rng & Units 06/07/2023    4:28 PM 12/28/2021    9:25 AM 07/28/2020    9:42 AM  Hepatic Function  Total Protein 6.0 - 8.3 g/dL 7.3  7.4  7.2   Albumin 3.5 - 5.2 g/dL 4.3  4.1  4.2   AST 0 - 37 U/L 14  14  14    ALT 0 - 53 U/L 16  14  17    Alk Phosphatase 39 - 117 U/L 75  67  73   Total Bilirubin 0.2 - 1.2 mg/dL 0.3  0.6  0.7   Bilirubin, Direct 0.0 - 0.3 mg/dL 0.1  0.1  0.1     Lab Results  Component Value Date   CHOL 245 (H) 06/07/2023   Lab Results  Component Value Date   HDL 45.80 06/07/2023   Lab Results  Component Value Date   LDLCALC 155 (H) 06/07/2023   Lab Results   Component Value Date   TRIG 221.0 (H) 06/07/2023   Lab Results  Component Value Date   CHOLHDL 5 06/07/2023   No results for input(s): "PSA" in the last 72 hours. No results found for: "HCVAB" No results found for: "VD25OH"   Lab Results  Component Value Date   HGBA1C 6.6 (H) 06/07/2023   HGBA1C 6.3 12/28/2021   HGBA1C 6.2 07/28/2020   Lab Results  Component Value Date   LDLCALC 155 (H) 06/07/2023   CREATININE 0.88 06/07/2023     Assessment and Plan:     ICD-10-CM   1. Essential hypertension  I10     2. Diet-controlled diabetes mellitus (HCC)  E11.9 Blood Glucose Monitoring Suppl (ACCU-CHEK GUIDE ME) w/Device KIT    glucose blood (ACCU-CHEK GUIDE TEST) test strip    Lancets Misc. (ACCU-CHEK FASTCLIX LANCET) KIT    Accu-Chek FastClix Lancets MISC     His blood pressure is really doing a lot better.  He is stable mount is not having any issues with his blood pressure.  New onset type 2 diabetes, he does have an A1c is 6.6 that is well-controlled right now.  I challenged him to try to lose 30 pounds by our next office visit, which will be in 5 months.  At that point we will redo all of his labs, check blood sugar, check lipids.  Lipids are also elevated.  I am going to have him review information on the American diabetes Association website, and we can initiate cholesterol medicine at follow-up.  Medication Management during today's office visit: Meds ordered this encounter  Medications   Blood Glucose Monitoring Suppl (ACCU-CHEK GUIDE ME) w/Device KIT    Sig: Use to check blood glucose once daily    Dispense:  1 kit    Refill:  0   glucose blood (ACCU-CHEK GUIDE TEST) test strip    Sig: Use to check  blood glucose once daily    Dispense:  100 each    Refill:  3   Lancets Misc. (ACCU-CHEK FASTCLIX LANCET) KIT    Sig: Use to check blood glucose once daily    Dispense:  1 kit    Refill:  0   Accu-Chek FastClix Lancets MISC    Sig: Use to check blood glucose once  daily    Dispense:  102 each    Refill:  3   There are no discontinued medications.  Orders placed today for conditions managed today: No orders of the defined types were placed in this encounter.   Disposition: Return in about 5 months (around 01/09/2024) for diabetes.  Dr[employer] One speech-to-text software was used for transcription in this dictation.  Possible transcriptional errors can occur using Animal nutritionist.   Signed,  Elpidio Galea. Weslie Rasmus, MD   Outpatient Encounter Medications as of 08/09/2023  Medication Sig   Accu-Chek FastClix Lancets MISC Use to check blood glucose once daily   amLODipine (NORVASC) 10 MG tablet Take 1 tablet (10 mg total) by mouth daily.   Blood Glucose Monitoring Suppl (ACCU-CHEK GUIDE ME) w/Device KIT Use to check blood glucose once daily   glucose blood (ACCU-CHEK GUIDE TEST) test strip Use to check blood glucose once daily   ibuprofen (ADVIL) 200 MG tablet Take 200 mg by mouth every 6 (six) hours as needed for headache, mild pain or moderate pain.   Lancets Misc. (ACCU-CHEK FASTCLIX LANCET) KIT Use to check blood glucose once daily   losartan (COZAAR) 100 MG tablet Take 1 tablet (100 mg total) by mouth daily.   No facility-administered encounter medications on file as of 08/09/2023.

## 2024-05-20 ENCOUNTER — Other Ambulatory Visit: Payer: Self-pay | Admitting: Family Medicine

## 2024-05-20 NOTE — Telephone Encounter (Signed)
Please schedule CPE with fasting labs prior for Dr. Copland.  

## 2024-05-21 NOTE — Telephone Encounter (Signed)
 lvm for pt to call office to schedule appt.
